# Patient Record
Sex: Female | Born: 1965 | Race: White | Hispanic: No
Health system: Southern US, Community
[De-identification: ages and names within clinical notes are randomized; demographics above are authoritative.]

---

## 2016-02-08 ENCOUNTER — Inpatient Hospital Stay (HOSPITAL_COMMUNITY): Payer: BLUE CROSS/BLUE SHIELD

## 2016-02-08 ENCOUNTER — Inpatient Hospital Stay (HOSPITAL_COMMUNITY)
Admission: AD | Admit: 2016-02-08 | Discharge: 2016-03-15 | DRG: 870 | Disposition: E | Payer: BLUE CROSS/BLUE SHIELD | Source: Other Acute Inpatient Hospital | Attending: Internal Medicine | Admitting: Internal Medicine

## 2016-02-08 DIAGNOSIS — Z66 Do not resuscitate: Secondary | ICD-10-CM | POA: Diagnosis not present

## 2016-02-08 DIAGNOSIS — K859 Acute pancreatitis without necrosis or infection, unspecified: Secondary | ICD-10-CM | POA: Diagnosis present

## 2016-02-08 DIAGNOSIS — R001 Bradycardia, unspecified: Secondary | ICD-10-CM | POA: Diagnosis present

## 2016-02-08 DIAGNOSIS — G9340 Encephalopathy, unspecified: Secondary | ICD-10-CM | POA: Insufficient documentation

## 2016-02-08 DIAGNOSIS — D696 Thrombocytopenia, unspecified: Secondary | ICD-10-CM | POA: Diagnosis present

## 2016-02-08 DIAGNOSIS — Z515 Encounter for palliative care: Secondary | ICD-10-CM | POA: Diagnosis not present

## 2016-02-08 DIAGNOSIS — D649 Anemia, unspecified: Secondary | ICD-10-CM | POA: Diagnosis present

## 2016-02-08 DIAGNOSIS — J189 Pneumonia, unspecified organism: Secondary | ICD-10-CM | POA: Diagnosis present

## 2016-02-08 DIAGNOSIS — I2782 Chronic pulmonary embolism: Secondary | ICD-10-CM | POA: Insufficient documentation

## 2016-02-08 DIAGNOSIS — Y95 Nosocomial condition: Secondary | ICD-10-CM | POA: Diagnosis present

## 2016-02-08 DIAGNOSIS — R601 Generalized edema: Secondary | ICD-10-CM | POA: Diagnosis present

## 2016-02-08 DIAGNOSIS — R600 Localized edema: Secondary | ICD-10-CM | POA: Diagnosis not present

## 2016-02-08 DIAGNOSIS — Z9911 Dependence on respirator [ventilator] status: Secondary | ICD-10-CM | POA: Insufficient documentation

## 2016-02-08 DIAGNOSIS — J80 Acute respiratory distress syndrome: Secondary | ICD-10-CM | POA: Insufficient documentation

## 2016-02-08 DIAGNOSIS — I2609 Other pulmonary embolism with acute cor pulmonale: Secondary | ICD-10-CM | POA: Diagnosis present

## 2016-02-08 DIAGNOSIS — A419 Sepsis, unspecified organism: Principal | ICD-10-CM | POA: Diagnosis present

## 2016-02-08 DIAGNOSIS — E109 Type 1 diabetes mellitus without complications: Secondary | ICD-10-CM | POA: Diagnosis present

## 2016-02-08 DIAGNOSIS — E870 Hyperosmolality and hypernatremia: Secondary | ICD-10-CM | POA: Diagnosis not present

## 2016-02-08 DIAGNOSIS — J9601 Acute respiratory failure with hypoxia: Secondary | ICD-10-CM | POA: Diagnosis not present

## 2016-02-08 DIAGNOSIS — I2601 Septic pulmonary embolism with acute cor pulmonale: Secondary | ICD-10-CM | POA: Insufficient documentation

## 2016-02-08 DIAGNOSIS — R609 Edema, unspecified: Secondary | ICD-10-CM | POA: Insufficient documentation

## 2016-02-08 DIAGNOSIS — R6521 Severe sepsis with septic shock: Secondary | ICD-10-CM | POA: Diagnosis present

## 2016-02-08 DIAGNOSIS — E873 Alkalosis: Secondary | ICD-10-CM | POA: Diagnosis present

## 2016-02-08 DIAGNOSIS — K863 Pseudocyst of pancreas: Secondary | ICD-10-CM | POA: Diagnosis present

## 2016-02-08 DIAGNOSIS — K852 Alcohol induced acute pancreatitis without necrosis or infection: Secondary | ICD-10-CM | POA: Diagnosis not present

## 2016-02-08 DIAGNOSIS — I82B11 Acute embolism and thrombosis of right subclavian vein: Secondary | ICD-10-CM | POA: Diagnosis not present

## 2016-02-08 DIAGNOSIS — E876 Hypokalemia: Secondary | ICD-10-CM | POA: Diagnosis present

## 2016-02-08 DIAGNOSIS — Z79899 Other long term (current) drug therapy: Secondary | ICD-10-CM

## 2016-02-08 DIAGNOSIS — I82A11 Acute embolism and thrombosis of right axillary vein: Secondary | ICD-10-CM | POA: Diagnosis not present

## 2016-02-08 DIAGNOSIS — J90 Pleural effusion, not elsewhere classified: Secondary | ICD-10-CM | POA: Diagnosis not present

## 2016-02-08 DIAGNOSIS — T501X5A Adverse effect of loop [high-ceiling] diuretics, initial encounter: Secondary | ICD-10-CM | POA: Diagnosis not present

## 2016-02-08 DIAGNOSIS — D72829 Elevated white blood cell count, unspecified: Secondary | ICD-10-CM | POA: Diagnosis present

## 2016-02-08 DIAGNOSIS — I82613 Acute embolism and thrombosis of superficial veins of upper extremity, bilateral: Secondary | ICD-10-CM | POA: Diagnosis not present

## 2016-02-08 DIAGNOSIS — I272 Other secondary pulmonary hypertension: Secondary | ICD-10-CM | POA: Diagnosis present

## 2016-02-08 DIAGNOSIS — I82611 Acute embolism and thrombosis of superficial veins of right upper extremity: Secondary | ICD-10-CM | POA: Diagnosis not present

## 2016-02-08 DIAGNOSIS — Z720 Tobacco use: Secondary | ICD-10-CM | POA: Diagnosis not present

## 2016-02-08 DIAGNOSIS — R579 Shock, unspecified: Secondary | ICD-10-CM | POA: Diagnosis not present

## 2016-02-08 DIAGNOSIS — E43 Unspecified severe protein-calorie malnutrition: Secondary | ICD-10-CM | POA: Diagnosis present

## 2016-02-08 DIAGNOSIS — R079 Chest pain, unspecified: Secondary | ICD-10-CM | POA: Diagnosis not present

## 2016-02-08 DIAGNOSIS — Z794 Long term (current) use of insulin: Secondary | ICD-10-CM | POA: Diagnosis not present

## 2016-02-08 DIAGNOSIS — I82C19 Acute embolism and thrombosis of unspecified internal jugular vein: Secondary | ICD-10-CM | POA: Diagnosis not present

## 2016-02-08 DIAGNOSIS — I2699 Other pulmonary embolism without acute cor pulmonale: Secondary | ICD-10-CM | POA: Diagnosis not present

## 2016-02-08 DIAGNOSIS — Z452 Encounter for adjustment and management of vascular access device: Secondary | ICD-10-CM

## 2016-02-08 DIAGNOSIS — L8990 Pressure ulcer of unspecified site, unspecified stage: Secondary | ICD-10-CM | POA: Insufficient documentation

## 2016-02-08 DIAGNOSIS — K858 Other acute pancreatitis without necrosis or infection: Secondary | ICD-10-CM | POA: Diagnosis not present

## 2016-02-08 DIAGNOSIS — Z4659 Encounter for fitting and adjustment of other gastrointestinal appliance and device: Secondary | ICD-10-CM | POA: Insufficient documentation

## 2016-02-08 LAB — LACTIC ACID, PLASMA: Lactic Acid, Venous: 1.6 mmol/L (ref 0.5–2.0)

## 2016-02-08 LAB — BLOOD GAS, ARTERIAL
Acid-base deficit: 6.7 mmol/L — ABNORMAL HIGH (ref 0.0–2.0)
BICARBONATE: 18.8 meq/L — AB (ref 20.0–24.0)
Drawn by: 418751
FIO2: 1
LHR: 16 {breaths}/min
O2 SAT: 93.4 %
PATIENT TEMPERATURE: 98.6
PCO2 ART: 41.5 mmHg (ref 35.0–45.0)
PEEP/CPAP: 15 cmH2O
PH ART: 7.279 — AB (ref 7.350–7.450)
Pressure control: 20 cmH2O
TCO2: 20.1 mmol/L (ref 0–100)
pO2, Arterial: 76.7 mmHg — ABNORMAL LOW (ref 80.0–100.0)

## 2016-02-08 LAB — COMPREHENSIVE METABOLIC PANEL
ALK PHOS: 81 U/L (ref 38–126)
ALT: 30 U/L (ref 14–54)
ANION GAP: 13 (ref 5–15)
AST: 76 U/L — ABNORMAL HIGH (ref 15–41)
Albumin: 1.5 g/dL — ABNORMAL LOW (ref 3.5–5.0)
BILIRUBIN TOTAL: 1.1 mg/dL (ref 0.3–1.2)
BUN: 6 mg/dL (ref 6–20)
CO2: 20 mmol/L — AB (ref 22–32)
CREATININE: 0.71 mg/dL (ref 0.44–1.00)
Calcium: 8.1 mg/dL — ABNORMAL LOW (ref 8.9–10.3)
Chloride: 104 mmol/L (ref 101–111)
Glucose, Bld: 234 mg/dL — ABNORMAL HIGH (ref 65–99)
Potassium: 4 mmol/L (ref 3.5–5.1)
SODIUM: 137 mmol/L (ref 135–145)
Total Protein: 4.8 g/dL — ABNORMAL LOW (ref 6.5–8.1)

## 2016-02-08 LAB — PROTIME-INR
INR: 1.34 (ref 0.00–1.49)
Prothrombin Time: 16.7 seconds — ABNORMAL HIGH (ref 11.6–15.2)

## 2016-02-08 LAB — CBC WITH DIFFERENTIAL/PLATELET
BASOS ABS: 0 10*3/uL (ref 0.0–0.1)
BASOS PCT: 0 %
EOS PCT: 0 %
Eosinophils Absolute: 0 10*3/uL (ref 0.0–0.7)
HEMATOCRIT: 28.9 % — AB (ref 36.0–46.0)
Hemoglobin: 9.4 g/dL — ABNORMAL LOW (ref 12.0–15.0)
Lymphocytes Relative: 3 %
Lymphs Abs: 0.4 10*3/uL — ABNORMAL LOW (ref 0.7–4.0)
MCH: 32.4 pg (ref 26.0–34.0)
MCHC: 32.5 g/dL (ref 30.0–36.0)
MCV: 99.7 fL (ref 78.0–100.0)
MONO ABS: 0.4 10*3/uL (ref 0.1–1.0)
MONOS PCT: 3 %
Neutro Abs: 12.5 10*3/uL — ABNORMAL HIGH (ref 1.7–7.7)
Neutrophils Relative %: 94 %
PLATELETS: 123 10*3/uL — AB (ref 150–400)
RBC: 2.9 MIL/uL — ABNORMAL LOW (ref 3.87–5.11)
RDW: 14.4 % (ref 11.5–15.5)
WBC: 13.3 10*3/uL — ABNORMAL HIGH (ref 4.0–10.5)

## 2016-02-08 LAB — MAGNESIUM: Magnesium: 1.5 mg/dL — ABNORMAL LOW (ref 1.7–2.4)

## 2016-02-08 LAB — GLUCOSE, CAPILLARY: Glucose-Capillary: 201 mg/dL — ABNORMAL HIGH (ref 65–99)

## 2016-02-08 LAB — LIPID PANEL
Cholesterol: 74 mg/dL (ref 0–200)
Triglycerides: 134 mg/dL (ref <150)
VLDL: 27 mg/dL (ref 0–40)

## 2016-02-08 LAB — PHOSPHORUS: PHOSPHORUS: 3.9 mg/dL (ref 2.5–4.6)

## 2016-02-08 LAB — LIPASE, BLOOD: Lipase: 20 U/L (ref 11–51)

## 2016-02-08 LAB — APTT: APTT: 38 s — AB (ref 24–37)

## 2016-02-08 MED ORDER — MIDAZOLAM HCL 2 MG/2ML IJ SOLN
2.0000 mg | INTRAMUSCULAR | Status: DC | PRN
  Administered 2016-02-09 (×4): 2 mg via INTRAVENOUS
  Filled 2016-02-08 (×3): qty 2

## 2016-02-08 MED ORDER — FUROSEMIDE 10 MG/ML IJ SOLN
40.0000 mg | Freq: Once | INTRAMUSCULAR | Status: AC
  Administered 2016-02-08: 40 mg via INTRAVENOUS
  Filled 2016-02-08: qty 4

## 2016-02-08 MED ORDER — SODIUM CHLORIDE 0.9 % IV SOLN
25.0000 ug/h | INTRAVENOUS | Status: DC
  Administered 2016-02-08 – 2016-02-09 (×2): 250 ug/h via INTRAVENOUS
  Administered 2016-02-10 – 2016-02-13 (×4): 100 ug/h via INTRAVENOUS
  Administered 2016-02-13 – 2016-02-14 (×2): 200 ug/h via INTRAVENOUS
  Administered 2016-02-14: 250 ug/h via INTRAVENOUS
  Administered 2016-02-15 (×3): 400 ug/h via INTRAVENOUS
  Filled 2016-02-08 (×15): qty 50

## 2016-02-08 MED ORDER — SODIUM CHLORIDE 0.9 % IV SOLN
250.0000 mL | INTRAVENOUS | Status: DC | PRN
  Administered 2016-02-09: 20 mL/h via INTRAVENOUS
  Administered 2016-02-11: 250 mL via INTRAVENOUS

## 2016-02-08 MED ORDER — ASPIRIN 300 MG RE SUPP
300.0000 mg | RECTAL | Status: AC
  Administered 2016-02-08: 300 mg via RECTAL
  Filled 2016-02-08: qty 1

## 2016-02-08 MED ORDER — ANTISEPTIC ORAL RINSE SOLUTION (CORINZ)
7.0000 mL | OROMUCOSAL | Status: DC
  Administered 2016-02-09 – 2016-02-15 (×65): 7 mL via OROMUCOSAL

## 2016-02-08 MED ORDER — FENTANYL CITRATE (PF) 100 MCG/2ML IJ SOLN: 50.0000 ug | Freq: Once | INTRAMUSCULAR | Status: DC

## 2016-02-08 MED ORDER — FENTANYL BOLUS VIA INFUSION
50.0000 ug | INTRAVENOUS | Status: DC | PRN
  Administered 2016-02-13 – 2016-02-15 (×3): 50 ug via INTRAVENOUS
  Filled 2016-02-08: qty 50

## 2016-02-08 MED ORDER — PANTOPRAZOLE SODIUM 40 MG IV SOLR
40.0000 mg | INTRAVENOUS | Status: DC
  Administered 2016-02-09 – 2016-02-10 (×2): 40 mg via INTRAVENOUS
  Filled 2016-02-08 (×2): qty 40

## 2016-02-08 MED ORDER — ENOXAPARIN SODIUM 40 MG/0.4ML ~~LOC~~ SOLN: 40.0000 mg | SUBCUTANEOUS | Status: DC

## 2016-02-08 MED ORDER — ASPIRIN 81 MG PO CHEW: 324.0000 mg | CHEWABLE_TABLET | ORAL | Status: AC

## 2016-02-08 MED ORDER — DOPAMINE-DEXTROSE 3.2-5 MG/ML-% IV SOLN: 2.5000 ug/kg/min | INTRAVENOUS | Status: DC

## 2016-02-08 MED ORDER — MIDAZOLAM HCL 2 MG/2ML IJ SOLN
2.0000 mg | INTRAMUSCULAR | Status: DC | PRN
  Administered 2016-02-08 – 2016-02-09 (×2): 2 mg via INTRAVENOUS
  Filled 2016-02-08 (×3): qty 2

## 2016-02-08 MED ORDER — SENNOSIDES 8.8 MG/5ML PO SYRP
5.0000 mL | ORAL_SOLUTION | Freq: Two times a day (BID) | ORAL | Status: DC | PRN
  Administered 2016-02-09: 5 mL
  Filled 2016-02-08 (×2): qty 5

## 2016-02-08 MED ORDER — INSULIN ASPART 100 UNIT/ML ~~LOC~~ SOLN
0.0000 [IU] | SUBCUTANEOUS | Status: DC
  Administered 2016-02-09: 3 [IU] via SUBCUTANEOUS
  Administered 2016-02-09: 8 [IU] via SUBCUTANEOUS
  Administered 2016-02-09 (×2): 2 [IU] via SUBCUTANEOUS
  Administered 2016-02-09 – 2016-02-10 (×4): 3 [IU] via SUBCUTANEOUS
  Administered 2016-02-10 (×2): 2 [IU] via SUBCUTANEOUS
  Administered 2016-02-11: 8 [IU] via SUBCUTANEOUS
  Administered 2016-02-11: 3 [IU] via SUBCUTANEOUS
  Administered 2016-02-11: 11 [IU] via SUBCUTANEOUS
  Administered 2016-02-11 (×2): 5 [IU] via SUBCUTANEOUS
  Administered 2016-02-11 (×2): 3 [IU] via SUBCUTANEOUS
  Administered 2016-02-12: 15 [IU] via SUBCUTANEOUS
  Administered 2016-02-12: 8 [IU] via SUBCUTANEOUS
  Administered 2016-02-12 (×2): 15 [IU] via SUBCUTANEOUS
  Administered 2016-02-12 – 2016-02-13 (×2): 11 [IU] via SUBCUTANEOUS
  Administered 2016-02-13 (×2): 8 [IU] via SUBCUTANEOUS
  Administered 2016-02-13: 15 [IU] via SUBCUTANEOUS
  Administered 2016-02-13 (×2): 11 [IU] via SUBCUTANEOUS
  Administered 2016-02-13: 5 [IU] via SUBCUTANEOUS
  Administered 2016-02-14: 3 [IU] via SUBCUTANEOUS
  Administered 2016-02-14 (×3): 8 [IU] via SUBCUTANEOUS
  Administered 2016-02-14 – 2016-02-15 (×2): 5 [IU] via SUBCUTANEOUS
  Administered 2016-02-15: 3 [IU] via SUBCUTANEOUS
  Administered 2016-02-15: 8 [IU] via SUBCUTANEOUS
  Administered 2016-02-15: 3 [IU] via SUBCUTANEOUS

## 2016-02-08 MED ORDER — CHLORHEXIDINE GLUCONATE 0.12% ORAL RINSE (MEDLINE KIT)
15.0000 mL | Freq: Two times a day (BID) | OROMUCOSAL | Status: DC
  Administered 2016-02-08 – 2016-02-15 (×14): 15 mL via OROMUCOSAL

## 2016-02-08 NOTE — Progress Notes (Signed)
Waste 100ml fentanyl with Ernie HewJohn Josiah Nieto RN

## 2016-02-08 NOTE — H&P (Signed)
PULMONARY / CRITICAL CARE MEDICINE   Name: Abigail Baker MRN: [id number] DOB: [birth year]    ADMISSION DATE:  01/27/2016  CHIEF COMPLAINT:  Pancreatitis  HISTORY OF PRESENT ILLNESS:   50 yo F with h/o DM, Etoh abuse, who presented to [facility] on 01/29/2016 after several weeks of abdominal pain, n/v.  She was found to have most likely EtOH pancreatitis, she developed ARDS and reportedly b/l PNA for which she was treated with abx.  She also developed a pancreatic pseudocyst.  She was transferred to [facility] for 24/7 CCM and advanced ventilator management given bouts of hypoxemia despite high PEEP/FiO2  PAST MEDICAL HISTORY :   has no past medical history on file.  has no past surgical history on file. Prior to Admission medications   Not on File   Allergies not on file  FAMILY HISTORY:  has no family status information on file.  SOCIAL HISTORY:    REVIEW OF SYSTEMS:  Unable to obtain  SUBJECTIVE:   VITAL SIGNS: Temp:  [97.7 F (36.5 C)] 97.7 F (36.5 C) (03/26 2152) Pulse Rate:  [90] 90 (03/26 2152) Resp:  [15] 15 (03/26 2152) BP: (127)/(42) 127/42 mmHg (03/26 2152) SpO2:  [94 %] 94 % (03/26 2154) FiO2 (%):  [100 %] 100 % (03/26 2154) Weight:  [74.9 kg (165 lb 2 oz)] 74.9 kg (165 lb 2 oz) (03/26 2152) HEMODYNAMICS:   VENTILATOR SETTINGS: Vent Mode:  [-] PCV FiO2 (%):  [100 %] 100 % Set Rate:  [16 bmp] 16 bmp PEEP:  [15 cmH20] 15 cmH20 INTAKE / OUTPUT:  Intake/Output Summary (Last 24 hours) at 01/16/2016 2232 Last data filed at 01/16/2016 2152  Gross per 24 hour  Intake     30 ml  Output    100 ml  Net    -70 ml    PHYSICAL EXAMINATION: General:  NAD Neuro:  Intubated and sedated,  Opens Eyes to voice but does not follow commands.  HEENT:  ETT in place, conjunctival edema Cardiovascular:  RRR, s1/s2 no m/r/g Lungs:  Course breath sounds b/l, no w/r/r Abdomen:  Soft, not TTP, no bowel sounds. No rebound or guarding Musculoskeletal:  Normal bulk and  tone Skin:  + Anasarca.  No c/c  LABS:  CBC No results for input(s): WBC, HGB, HCT, PLT in the last 168 hours. Coag's No results for input(s): APTT, INR in the last 168 hours. BMET No results for input(s): NA, K, CL, CO2, BUN, CREATININE, GLUCOSE in the last 168 hours. Electrolytes No results for input(s): CALCIUM, MG, PHOS in the last 168 hours. Sepsis Markers No results for input(s): LATICACIDVEN, PROCALCITON, O2SATVEN in the last 168 hours. ABG No results for input(s): PHART, PCO2ART, PO2ART in the last 168 hours. Liver Enzymes No results for input(s): AST, ALT, ALKPHOS, BILITOT, ALBUMIN in the last 168 hours. Cardiac Enzymes No results for input(s): TROPONINI, PROBNP in the last 168 hours. Glucose  Recent Labs Lab 02/11/2016 2150  GLUCAP 201*    Imaging No results found.   ASSESSMENT / PLAN: 50 yo with acute EtOH pancreatitis and resulting ARDS with anasarca and prolonged hospital course.  PULMONARY A: ARDS Intubated 3/16 B/L pleural effusion  P:   - Low TV ventilation/ARDS protocol - Plat <30 - Permissive hypercapnia pH >7.2 - ABG now - currently satting 95-100% - Goal O2 88-95% - Diuresis goal net negative 1-2L as BP tolerates - Fentanyl gtt and versed pushes for sedation - Oral care - PPI  CARDIOVASCULAR A:  Not  active P:   RENAL A:   AGMA - corrected AG = 19 P:   - Lactate negative - normal BUN - check UA for ketones - monitor AG  GASTROINTESTINAL A:   Acute EtOH pancreatitis Pancreatic pseudocyst x2 per OSH CT abdomen report  P:   - start tube feeding  - continue supportive care  HEMATOLOGIC A:   Anemia - no prior H/H Thrombocytopenia - likely EtOH S/p transfusion of PRBC for Hb of 8 P:  - no further transfusions - transfuse for Hb <7   INFECTIOUS A:   B/L PNA reported at OSH - s/p 10 days abx, unclear if this was real or simple severe ARDS P:   - holding ABX for now - check procalcitonin  ENDOCRINE A:   DM   P:    - SSI - add basal when Tube feeds are started - check Lipid panel  NEUROLOGIC A:   Sedated  P:   RASS goal: 0 -Fentanyl gtt - weaning this down now - Versed PRN (trying to minimize)    Total critical care time: 45 min  Critical care time was exclusive of separately billable procedures and treating other patients.  Critical care was necessary to treat or prevent imminent or life-threatening deterioration.  Critical care was time spent personally by me on the following activities: development of treatment plan with patient and/or surrogate as well as nursing, discussions with consultants, evaluation of patient's response to treatment, examination of patient, obtaining history from patient or surrogate, ordering and performing treatments and interventions, ordering and review of laboratory studies, ordering and review of radiographic studies, pulse oximetry and re-evaluation of patient's condition.   Galvin Profferaniel Negin Hegg, DO., MS [facility]    [facility] and Critical Care Medicine [facility] Pager: [phone]  02/01/2016, 10:32 PM

## 2016-02-08 NOTE — Progress Notes (Signed)
eLink Physician-Brief Progress Note Patient Name: Hardie Loraammy Hazan DOB: 01/19/1966 MRN: [id number]   Date of Service  01/30/2016  HPI/Events of Note  Hypotension  eICU Interventions  Dopamine 2.5 SCDs Protonix     Intervention Category Major Interventions: Hypotension - evaluation and management  Kristel Durkee 01/19/2016, 11:51 PM

## 2016-02-09 ENCOUNTER — Inpatient Hospital Stay (HOSPITAL_COMMUNITY): Payer: BLUE CROSS/BLUE SHIELD

## 2016-02-09 DIAGNOSIS — R579 Shock, unspecified: Secondary | ICD-10-CM

## 2016-02-09 DIAGNOSIS — L8990 Pressure ulcer of unspecified site, unspecified stage: Secondary | ICD-10-CM | POA: Insufficient documentation

## 2016-02-09 DIAGNOSIS — I2699 Other pulmonary embolism without acute cor pulmonale: Secondary | ICD-10-CM | POA: Insufficient documentation

## 2016-02-09 DIAGNOSIS — K852 Alcohol induced acute pancreatitis without necrosis or infection: Secondary | ICD-10-CM

## 2016-02-09 DIAGNOSIS — J9601 Acute respiratory failure with hypoxia: Secondary | ICD-10-CM | POA: Insufficient documentation

## 2016-02-09 DIAGNOSIS — J80 Acute respiratory distress syndrome: Secondary | ICD-10-CM

## 2016-02-09 LAB — POCT I-STAT 3, ART BLOOD GAS (G3+)
ACID-BASE DEFICIT: 4 mmol/L — AB (ref 0.0–2.0)
ACID-BASE EXCESS: 1 mmol/L (ref 0.0–2.0)
Acid-base deficit: 6 mmol/L — ABNORMAL HIGH (ref 0.0–2.0)
Acid-base deficit: 4 mmol/L — ABNORMAL HIGH (ref 0.0–2.0)
BICARBONATE: 26.8 meq/L — AB (ref 20.0–24.0)
BICARBONATE: 21.4 meq/L (ref 20.0–24.0)
BICARBONATE: 23.3 meq/L (ref 20.0–24.0)
Bicarbonate: 22.2 mEq/L (ref 20.0–24.0)
O2 SAT: 64 %
O2 SAT: 53 %
O2 SAT: 66 %
O2 Saturation: 89 %
PH ART: 7.3 — AB (ref 7.350–7.450)
PO2 ART: 34 mmHg — AB (ref 80.0–100.0)
Patient temperature: 36.6
TCO2: 28 mmol/L (ref 0–100)
TCO2: 23 mmol/L (ref 0–100)
TCO2: 25 mmol/L (ref 0–100)
TCO2: 24 mmol/L (ref 0–100)
pCO2 arterial: 46.9 mmHg — ABNORMAL HIGH (ref 35.0–45.0)
pCO2 arterial: 49.4 mmHg — ABNORMAL HIGH (ref 35.0–45.0)
pCO2 arterial: 53.2 mmHg — ABNORMAL HIGH (ref 35.0–45.0)
pCO2 arterial: 44.5 mmHg (ref 35.0–45.0)
pH, Arterial: 7.364 (ref 7.350–7.450)
pH, Arterial: 7.244 — ABNORMAL LOW (ref 7.350–7.450)
pH, Arterial: 7.25 — ABNORMAL LOW (ref 7.350–7.450)
pO2, Arterial: 33 mmHg — CL (ref 80.0–100.0)
pO2, Arterial: 40 mmHg — ABNORMAL LOW (ref 80.0–100.0)
pO2, Arterial: 58 mmHg — ABNORMAL LOW (ref 80.0–100.0)

## 2016-02-09 LAB — BASIC METABOLIC PANEL
ANION GAP: 15 (ref 5–15)
BUN: 8 mg/dL (ref 6–20)
CALCIUM: 8.1 mg/dL — AB (ref 8.9–10.3)
CO2: 18 mmol/L — AB (ref 22–32)
Chloride: 105 mmol/L (ref 101–111)
Creatinine, Ser: 0.61 mg/dL (ref 0.44–1.00)
Glucose, Bld: 167 mg/dL — ABNORMAL HIGH (ref 65–99)
Potassium: 2.9 mmol/L — ABNORMAL LOW (ref 3.5–5.1)
Sodium: 138 mmol/L (ref 135–145)

## 2016-02-09 LAB — URINALYSIS, ROUTINE W REFLEX MICROSCOPIC
Bilirubin Urine: NEGATIVE
GLUCOSE, UA: NEGATIVE mg/dL
HGB URINE DIPSTICK: NEGATIVE
Ketones, ur: NEGATIVE mg/dL
Leukocytes, UA: NEGATIVE
Nitrite: NEGATIVE
PH: 5 (ref 5.0–8.0)
Protein, ur: NEGATIVE mg/dL
SPECIFIC GRAVITY, URINE: 1.007 (ref 1.005–1.030)

## 2016-02-09 LAB — GLUCOSE, CAPILLARY
GLUCOSE-CAPILLARY: 139 mg/dL — AB (ref 65–99)
GLUCOSE-CAPILLARY: 148 mg/dL — AB (ref 65–99)
Glucose-Capillary: 176 mg/dL — ABNORMAL HIGH (ref 65–99)
Glucose-Capillary: 186 mg/dL — ABNORMAL HIGH (ref 65–99)
Glucose-Capillary: 188 mg/dL — ABNORMAL HIGH (ref 65–99)
Glucose-Capillary: 252 mg/dL — ABNORMAL HIGH (ref 65–99)

## 2016-02-09 LAB — MRSA PCR SCREENING: MRSA BY PCR: NEGATIVE

## 2016-02-09 LAB — MAGNESIUM: Magnesium: 2.1 mg/dL (ref 1.7–2.4)

## 2016-02-09 LAB — BRAIN NATRIURETIC PEPTIDE: B NATRIURETIC PEPTIDE 5: 777.2 pg/mL — AB (ref 0.0–100.0)

## 2016-02-09 LAB — TROPONIN I: TROPONIN I: 0.1 ng/mL — AB (ref <0.031)

## 2016-02-09 LAB — PROCALCITONIN: PROCALCITONIN: 0.14 ng/mL

## 2016-02-09 MED ORDER — SODIUM CHLORIDE 0.9 % IV SOLN: INTRAVENOUS | Status: DC | PRN

## 2016-02-09 MED ORDER — THIAMINE HCL 100 MG/ML IJ SOLN
100.0000 mg | Freq: Every day | INTRAMUSCULAR | Status: DC
  Administered 2016-02-09 – 2016-02-15 (×7): 100 mg via INTRAVENOUS
  Filled 2016-02-09: qty 1
  Filled 2016-02-09: qty 2
  Filled 2016-02-09 (×9): qty 1

## 2016-02-09 MED ORDER — ADULT MULTIVITAMIN LIQUID CH
15.0000 mL | Freq: Every day | ORAL | Status: DC
  Administered 2016-02-09 – 2016-02-15 (×7): 15 mL
  Filled 2016-02-09 (×8): qty 15

## 2016-02-09 MED ORDER — PRO-STAT SUGAR FREE PO LIQD
30.0000 mL | Freq: Two times a day (BID) | ORAL | Status: DC
  Administered 2016-02-09 – 2016-02-15 (×13): 30 mL
  Filled 2016-02-09 (×18): qty 30

## 2016-02-09 MED ORDER — PHENYLEPHRINE HCL 10 MG/ML IJ SOLN
0.0000 ug/min | INTRAVENOUS | Status: DC
  Administered 2016-02-09: 25 ug/min via INTRAVENOUS
  Administered 2016-02-10: 100 ug/min via INTRAVENOUS
  Administered 2016-02-10: 90 ug/min via INTRAVENOUS
  Administered 2016-02-10: 80 ug/min via INTRAVENOUS
  Administered 2016-02-10: 200 ug/min via INTRAVENOUS
  Administered 2016-02-11: 80 ug/min via INTRAVENOUS
  Administered 2016-02-11: 270 ug/min via INTRAVENOUS
  Administered 2016-02-11: 80 ug/min via INTRAVENOUS
  Administered 2016-02-11: 150 ug/min via INTRAVENOUS
  Administered 2016-02-11: 260 ug/min via INTRAVENOUS
  Administered 2016-02-12: 70 ug/min via INTRAVENOUS
  Administered 2016-02-13: 30 ug/min via INTRAVENOUS
  Administered 2016-02-13: 55 ug/min via INTRAVENOUS
  Filled 2016-02-09 (×14): qty 4

## 2016-02-09 MED ORDER — MAGNESIUM SULFATE 2 GM/50ML IV SOLN
2.0000 g | Freq: Once | INTRAVENOUS | Status: AC
  Administered 2016-02-09: 2 g via INTRAVENOUS
  Filled 2016-02-09: qty 50

## 2016-02-09 MED ORDER — QUETIAPINE FUMARATE 50 MG PO TABS
50.0000 mg | ORAL_TABLET | Freq: Every day | ORAL | Status: DC
  Administered 2016-02-09 – 2016-02-10 (×2): 50 mg via ORAL
  Filled 2016-02-09: qty 1
  Filled 2016-02-09: qty 2

## 2016-02-09 MED ORDER — PHENYLEPHRINE HCL 10 MG/ML IJ SOLN
0.0000 ug/min | INTRAVENOUS | Status: DC
  Administered 2016-02-09: 20 ug/min via INTRAVENOUS
  Filled 2016-02-09 (×2): qty 1

## 2016-02-09 MED ORDER — MAGNESIUM SULFATE IN D5W 10-5 MG/ML-% IV SOLN
1.0000 g | Freq: Once | INTRAVENOUS | Status: AC
  Administered 2016-02-09: 1 g via INTRAVENOUS
  Filled 2016-02-09: qty 100

## 2016-02-09 MED ORDER — DEXMEDETOMIDINE HCL IN NACL 400 MCG/100ML IV SOLN
0.4000 ug/kg/h | INTRAVENOUS | Status: DC
  Filled 2016-02-09 (×2): qty 100

## 2016-02-09 MED ORDER — IOPAMIDOL (ISOVUE-370) INJECTION 76%
INTRAVENOUS | Status: AC
  Administered 2016-02-09: 80 mL
  Filled 2016-02-09: qty 100

## 2016-02-09 MED ORDER — POTASSIUM CHLORIDE 10 MEQ/50ML IV SOLN
10.0000 meq | INTRAVENOUS | Status: AC
  Administered 2016-02-09 – 2016-02-10 (×6): 10 meq via INTRAVENOUS
  Filled 2016-02-09: qty 50

## 2016-02-09 MED ORDER — HEPARIN (PORCINE) IN NACL 100-0.45 UNIT/ML-% IJ SOLN
1000.0000 [IU]/h | INTRAMUSCULAR | Status: DC
  Administered 2016-02-09 – 2016-02-10 (×3): 1000 [IU]/h via INTRAVENOUS
  Filled 2016-02-09 (×4): qty 250

## 2016-02-09 MED ORDER — HEPARIN BOLUS VIA INFUSION
3500.0000 [IU] | Freq: Once | INTRAVENOUS | Status: AC
  Administered 2016-02-09: 3500 [IU] via INTRAVENOUS
  Filled 2016-02-09: qty 3500

## 2016-02-09 MED ORDER — VITAL HIGH PROTEIN PO LIQD
1000.0000 mL | ORAL | Status: DC
  Administered 2016-02-11: 1000 mL
  Administered 2016-02-12: 06:00:00
  Administered 2016-02-13: 1000 mL

## 2016-02-09 MED ORDER — FOLIC ACID 5 MG/ML IJ SOLN
1.0000 mg | Freq: Every day | INTRAMUSCULAR | Status: DC
  Administered 2016-02-09 – 2016-02-15 (×7): 1 mg via INTRAVENOUS
  Filled 2016-02-09: qty 0.2
  Filled 2016-02-09: qty 0
  Filled 2016-02-09: qty 0.2
  Filled 2016-02-09: qty 0
  Filled 2016-02-09: qty 0.2
  Filled 2016-02-09: qty 0
  Filled 2016-02-09: qty 0.2
  Filled 2016-02-09: qty 0
  Filled 2016-02-09 (×2): qty 0.2
  Filled 2016-02-09 (×2): qty 0
  Filled 2016-02-09: qty 0.2

## 2016-02-09 MED ORDER — IPRATROPIUM-ALBUTEROL 0.5-2.5 (3) MG/3ML IN SOLN
3.0000 mL | Freq: Four times a day (QID) | RESPIRATORY_TRACT | Status: DC
  Administered 2016-02-09 – 2016-02-15 (×24): 3 mL via RESPIRATORY_TRACT
  Filled 2016-02-09 (×24): qty 3

## 2016-02-09 MED ORDER — NICOTINE 7 MG/24HR TD PT24
7.0000 mg | MEDICATED_PATCH | Freq: Every day | TRANSDERMAL | Status: DC
  Administered 2016-02-09 – 2016-02-11 (×3): 7 mg via TRANSDERMAL
  Filled 2016-02-09 (×4): qty 1

## 2016-02-09 MED ORDER — HEPARIN SODIUM (PORCINE) 5000 UNIT/ML IJ SOLN
5000.0000 [IU] | Freq: Three times a day (TID) | INTRAMUSCULAR | Status: DC
  Administered 2016-02-09: 5000 [IU] via SUBCUTANEOUS
  Filled 2016-02-09: qty 1

## 2016-02-09 MED ORDER — MIDAZOLAM BOLUS VIA INFUSION
1.0000 mg | INTRAVENOUS | Status: DC | PRN
  Administered 2016-02-12 (×3): 1 mg via INTRAVENOUS
  Administered 2016-02-12 – 2016-02-13 (×3): 2 mg via INTRAVENOUS
  Administered 2016-02-13: 1 mg via INTRAVENOUS
  Administered 2016-02-14: 2 mg via INTRAVENOUS
  Administered 2016-02-14 – 2016-02-15 (×3): 4 mg via INTRAVENOUS
  Filled 2016-02-09 (×12): qty 4

## 2016-02-09 MED ORDER — SODIUM CHLORIDE 0.9 % IV SOLN
1.0000 mg/h | INTRAVENOUS | Status: DC
  Administered 2016-02-09: 1 mg/h via INTRAVENOUS
  Administered 2016-02-10 – 2016-02-11 (×2): 2 mg/h via INTRAVENOUS
  Administered 2016-02-12: 4 mg/h via INTRAVENOUS
  Administered 2016-02-12: 3 mg/h via INTRAVENOUS
  Administered 2016-02-13: 5 mg/h via INTRAVENOUS
  Administered 2016-02-14: 2 mg/h via INTRAVENOUS
  Administered 2016-02-14: 5 mg/h via INTRAVENOUS
  Administered 2016-02-14: 7 mg/h via INTRAVENOUS
  Administered 2016-02-15 – 2016-02-16 (×4): 8 mg/h via INTRAVENOUS
  Filled 2016-02-09 (×17): qty 10

## 2016-02-09 MED FILL — Dopamine in Dextrose 5% Inj 3.2 MG/ML: INTRAVENOUS | Qty: 250 | Status: AC

## 2016-02-09 NOTE — Progress Notes (Signed)
Respiratory therapy note- Unable after 3 attempts to thread arterial line radial. Dr. Jamison NeighborNestor notified by RN.

## 2016-02-09 NOTE — Progress Notes (Signed)
eLink Physician-Brief Progress Note Patient Name: Hardie Loraammy Deveney DOB: 02/23/1966 MRN: [id number]   Date of Service  02/09/2016  HPI/Events of Note  K+ = 2.9 and Creatinine = 0.61.  eICU Interventions  Will replete K+.      Intervention Category Intermediate Interventions: Electrolyte abnormality - evaluation and management  Yaniris Braddock Eugene 02/09/2016, 7:01 PM

## 2016-02-09 NOTE — Significant Event (Signed)
Received called from Radiology regarding CT showing large PEs. Spoke to CCM. To start on heparin drip.

## 2016-02-09 NOTE — Progress Notes (Signed)
eLink Physician-Brief Progress Note Patient Name: Abigail Baker DOB: 05/06/1966 MRN: [id number]   Date of Service  02/09/2016  HPI/Events of Note  Multiple issues: 1. PE _ R lung clot, 2. BP = 120 on Phenylephrine IV infusion and 3. Sats in 80's on 100% FiO2.   eICU Interventions  Will order: 1. Heparin IV infusion per pharmacy.  2. Will ask Dr. Christene Slatese Dios to evaluate the patient at bedside for possible systemic lytic Rx.      Intervention Category Major Interventions: Hypoxemia - evaluation and management  Haniel Fix Eugene 02/09/2016, 5:21 PM

## 2016-02-09 NOTE — Significant Event (Signed)
Spoke with Dr. Celene SkeenNester regarding patient's VS and ABG results. MD at bedside. New orders given.

## 2016-02-09 NOTE — Significant Event (Signed)
HR dropped to 30s whenever patient is agitated and biting on ET tube. Bite block placed on patient-HR did not drop anymore afterwards.

## 2016-02-09 NOTE — Progress Notes (Signed)
ANTICOAGULATION CONSULT NOTE - Initial Consult  Pharmacy Consult for heparin Indication: pulmonary embolus  No Known Allergies  Patient Measurements: Height: 5\' 1"  (154.9 cm) Weight: 157 lb 6.5 oz (71.4 kg) IBW/kg (Calculated) : 47.8 Heparin Dosing Weight: 62 kg  Vital Signs: Temp: 98.4 F (36.9 C) (03/27 1615) Temp Source: Core (Comment) (03/27 1200) BP: 99/79 mmHg (03/27 1615) Pulse Rate: 95 (03/27 1615)  Labs:  Recent Labs  05-Jan-2016 2305  HGB 9.4*  HCT 28.9*  PLT 123*  APTT 38*  LABPROT 16.7*  INR 1.34  CREATININE 0.71    Estimated Creatinine Clearance: 78.5 mL/min (by C-G formula based on Cr of 0.71).  Assessment: [age] YOF who presented from [facility] on 01/29/16 with likely EtOH pancreatitis and ARDS for ventilator management    Now with PE on CTA - small PE in LLL branch; large PE in rPA - submassive PE  No AC PTA  Goal of Therapy:  Heparin level 0.3-0.7 units/ml Monitor platelets by anticoagulation protocol: Yes   Plan:  Give 3500 units bolus x 1 Start heparin infusion at 1000 units/hr Check anti-Xa level in 6 hours and daily while on heparin Continue to monitor H&H and platelets  Isaac BlissMichael Anju Sereno, PharmD, BCPS, [facility] Clinical Pharmacist Pager [phone] 02/09/2016 5:28 PM

## 2016-02-09 NOTE — Progress Notes (Addendum)
PULMONARY / CRITICAL CARE MEDICINE   Name: Abigail Baker MRN: [id number] DOB: 08-21-1966    ADMISSION DATE:  01/30/2016  CHIEF COMPLAINT:  Pancreatitis  HISTORY OF PRESENT ILLNESS:   50 yo F with h/o DM, Etoh abuse, who presented to [facility] on 01/29/2016 after several weeks of abdominal pain, n/v.  She was found to have most likely EtOH pancreatitis, she developed ARDS and reportedly b/l PNA for which she was treated with abx.  She also developed a pancreatic pseudocyst.  She was transferred to [facility] hospital for 24/7 CCM and advanced ventilator management given bouts of hypoxemia despite high PEEP/FiO2  SUBJECTIVE: Patient was transferred from OSH last evening. Patient has having periods of desaturation & bradycardia likely secondary to biting her endotracheal tube with stimulation. Bite block was placed.  Still on 100% FiO2 PEEP 14.   Neo drip less this pm. CTA showed DPLD, ARDS, also with a big embolus in R PA and small embolus in LLL. IR has been contacted for EKOS, lytics.   REVIEW OF SYSTEMS:  Unable to obtain given intubation & sedation.  VITAL SIGNS: Temp:  [96.8 F (36 C)-98.6 F (37 C)] 98.4 F (36.9 C) (03/27 1615) Pulse Rate:  [42-108] 95 (03/27 1615) Resp:  [0-33] 20 (03/27 1615) BP: (70-136)/(39-114) 99/79 mmHg (03/27 1615) SpO2:  [52 %-99 %] 88 % (03/27 1700) Arterial Line BP: (82-128)/(43-72) 127/60 mmHg (03/27 1615) FiO2 (%):  [80 %-100 %] 100 % (03/27 1550) Weight:  [157 lb 6.5 oz (71.4 kg)-165 lb 2 oz (74.9 kg)] 157 lb 6.5 oz (71.4 kg) (03/27 0500) HEMODYNAMICS:   VENTILATOR SETTINGS: Vent Mode:  [-] PCV FiO2 (%):  [80 %-100 %] 100 % Set Rate:  [16 bmp] 16 bmp Vt Set:  [380 mL] 380 mL PEEP:  [12 cmH20-15 cmH20] 14 cmH20 Plateau Pressure:  [11 cmH20-34 cmH20] 31 cmH20 INTAKE / OUTPUT:  Intake/Output Summary (Last 24 hours) at 02/09/16 1900 Last data filed at 02/09/16 1100  Gross per 24 hour  Intake 388.25 ml  Output   2985 ml  Net -2596.75 ml     PHYSICAL EXAMINATION: General:  Sedated. No distress. Mother & sister at bedside.  Neuro:  Intubated and sedated. Spontaneously moving all 4 extremities.  HEENT:  Endotracheal tube in place. No scleral icterus or injection. Cardiovascular:  Regular rate. Anasarca. Normal S1 & S2. Lungs:  Course breath sounds bilaterally. Symmetric chest wall rise on ventilator.  Abdomen:  Soft. Nondistended. Hypoactive bowel sounds. Integument:  Warm & Dry. No rash on exposed skin.  LABS:  CBC  Recent Labs Lab 02/13/2016 2305  WBC 13.3*  HGB 9.4*  HCT 28.9*  PLT 123*   Coag's  Recent Labs Lab 02/11/2016 2305  APTT 38*  INR 1.34   BMET  Recent Labs Lab 02/12/2016 2305 02/09/16 1730  NA 137 138  K 4.0 2.9*  CL 104 105  CO2 20* 18*  BUN 6 8  CREATININE 0.71 0.61  GLUCOSE 234* 167*   Electrolytes  Recent Labs Lab 01/27/2016 2305 02/09/16 1730  CALCIUM 8.1* 8.1*  MG 1.5* 2.1  PHOS 3.9  --    Sepsis Markers  Recent Labs Lab 01/29/2016 2305  LATICACIDVEN 1.6  PROCALCITON 0.14   ABG  Recent Labs Lab 02/09/16 1245 02/09/16 1311 02/09/16 1847  PHART 7.250* 7.244* 7.300*  PCO2ART 53.2* 49.4* 44.5  PO2ART 33.0* 40.0* 58.0*   Liver Enzymes  Recent Labs Lab 01/31/2016 2305  AST 76*  ALT 30  ALKPHOS 81  BILITOT 1.1  ALBUMIN 1.5*   Cardiac Enzymes No results for input(s): TROPONINI, PROBNP in the last 168 hours. Glucose  Recent Labs Lab 01/25/2016 2150 02/09/16 0014 02/09/16 0349 02/09/16 0830 02/09/16 1227 02/09/16 1613  GLUCAP 201* 252* 188* 139* 186* 176*    Imaging Ct Angio Chest Pe W/cm &/or Wo Cm  02/09/2016  CLINICAL DATA:  Acute respiratory failure with hypoxia. EXAM: CT ANGIOGRAPHY CHEST WITH CONTRAST TECHNIQUE: Multidetector CT imaging of the chest was performed using the standard protocol during bolus administration of intravenous contrast. Multiplanar CT image reconstructions and MIPs were obtained to evaluate the vascular anatomy. CONTRAST:  80  mL of Isovue 370 intravenously. COMPARISON:  None. FINDINGS: No pneumothorax is noted. Endotracheal tube is in grossly good position. Moderate bilateral pleural effusions are noted. Diffuse bilateral lung opacities are noted concerning for pneumonia or edema. There is no evidence of thoracic aortic dissection or aneurysm. Small embolus is noted in lower lobe branch of left pulmonary artery. Large embolus is noted in the right pulmonary artery which extends into upper and lower lobe branches. Moderate anasarca is noted. RV/LV ratio of 1.1 is noted. Multiple old left rib fractures are noted. Review of the MIP images confirms the above findings. IMPRESSION: Diffuse bilateral lung opacities are noted concerning for edema or pneumonia. Moderate bilateral pleural effusions are noted. Moderate anasarca is noted. Small pulmonary embolus is noted peripherally in lower lobe branch of left pulmonary artery. Large central embolus is noted in the right pulmonary artery. Positive for acute PE with CT evidence of right heart strain (RV/LV Ratio = 1.1) consistent with at least submassive (intermediate risk) PE. The presence of right heart strain has been associated with an increased risk of morbidity and mortality. Please activate Code PE by paging [phone]. Critical Value/emergent results were called by telephone at the time of interpretation on 02/09/2016 at 5:05 pm to [facility] Ksor, patient's nurse, who verbally acknowledged these results and will immediately contact the ordering physician. Electronically Signed   By: Lupita RaiderJames  Green Jr, M.D.   On: 02/09/2016 17:06   Dg Chest Port 1 View  02/09/2016  CLINICAL DATA:  Acute respiratory failure with hypoxia. EXAM: PORTABLE CHEST 1 VIEW COMPARISON:  Chest x-ray dated 02/04/2016. FINDINGS: Endotracheal tube remains well positioned with tip just above the level of the carina. Enteric tube passes below the diaphragm. Right IJ central line is well positioned with tip at the level of the  lower SVC. Heart size is normal. Overall cardiomediastinal silhouette is stable in size and configuration. The diffuse bilateral pulmonary edema pattern is stable, perhaps slightly improved aeration bilaterally. Dense opacities at each lung base are unchanged, most likely atelectasis and/or small effusions. IMPRESSION: Stable exam with diffuse bilateral pulmonary edema pattern versus ARDS, perhaps mildly improved aeration bilaterally. Persistent bibasilar atelectasis and/or small effusions. Tubes and lines remain well-positioned. A left lateral rib fracture was suspected on yesterday's exam, better seen on the previous chest x-ray. Electronically Signed   By: Bary RichardStan  Maynard M.D.   On: 02/09/2016 13:54   Dg Chest Port 1 View  01/26/2016  CLINICAL DATA:  Endotracheal tube placement.  Initial encounter. EXAM: PORTABLE CHEST 1 VIEW COMPARISON:  None. FINDINGS: The patient's endotracheal tube is seen ending 1-2 cm above the carina. This could be retracted 1-2 cm, as deemed clinically appropriate. A right IJ line is noted ending about the cavoatrial junction. The patient's enteric tube is noted extending below the diaphragm. Diffuse bilateral airspace opacification may reflect pulmonary edema or pneumonia.  ARDS cannot be excluded. Small bilateral pleural effusions are suspected. No pneumothorax is seen. The cardiomediastinal silhouette is normal in size. There is suggestion of a minimally displaced fracture of the left lateral fifth rib. IMPRESSION: 1. Endotracheal tube seen ending 1-2 cm above the carina. This could be retracted 1-2 cm, as deemed clinically appropriate. 2. Right IJ line noted ending about the cavoatrial junction. 3. Diffuse bilateral airspace opacification may reflect pulmonary edema or pneumonia. ARDS cannot be excluded. Small bilateral pleural effusions suspected. 4. Suggestion of a minimally displaced fracture of the left lateral fifth rib. Electronically Signed   By: Roanna Raider M.D.   On:  01/21/2016 23:23   EVENTS: 3/16 - Admit to OSH [facility]) 3/19 - Worsening hypoxia & altered mentation. Transferred to ICU. Failed trial of BiPAP & was intubated. 3/26 - Transfer to [facility] 3/27 CTA with big R PA embolus, small LLL embolus. IR consulted.   STUDIES: CT ABD/PELVIS OSH 3/23:  Subacute pancreatitis w/ 6.9x3.6cm pseudocyst in lesser sac & 7mm pseudocyst in tail. Moderate bilateral pleural effusions.  Port CXR 3/26:  Personally reviewed by me. R IJ at cavoatrial junction. ETT 2cm above carina. Bilateral opacities.  MICROBIOLOGY: MRSA PCR 3/26:  Negative   ANTIBIOTICS: None.  LINES/TUBES: OETT [place] 3/19>>> R IJ CVL 3/19 or 3/20>>> OGT>>> F[place]>>>  ASSESSMENT / PLAN:  PULMONARY A: ARDS Pulmonary Embolus in R PA, and LLL PA. With evidence of RHS.  Bilateral Pleural Effusions Tobacco Use  P:   Full Vent Support w/ PRVC 8cc/kg IBW Holding on further diuresis given hypotension Duoneb q6hr Nicotine Patch /24hr ABG at noon Spoke to IR re: lytics/EKOS >> IR to see pt.  Order Echo, bnp, troponin.   No family at bedside.   Addendum :  1. spoke to IR >> hold off on lytics for now. Try to do thoracentesis and see if oxygenation improves. 2. Pt has other issues affecting hypoxemia. Cont heparin drip. diuresce when BP allows. Check echo, bnp, troponin. If worsens clinically, re consult IR re: lytics. Will need a cranial ct scan.   I spent 30 minutes of critical care time with this pt today.    Pollie Meyer, MD 02/09/2016, 7:04 PM [facility] Pager [phone] After 3 pm or if no answer, call [phone]

## 2016-02-09 NOTE — Progress Notes (Signed)
Initial Nutrition Assessment  DOCUMENTATION CODES:   Obesity unspecified  INTERVENTION:    Once CORTRAK tube placed, initiate Vital High Protein at 20 ml/hr and increase by 10 ml in 4 hours to goal rate of 30 ml/hr   Prostat liquid protein 30 ml BID   Liquid multivitamin daily  Total above TF regimen to provide 920 kcals, 93 gm protein, 602 ml of free water  NUTRITION DIAGNOSIS:   Inadequate oral intake related to inability to eat as evidenced by NPO status  GOAL:   Provide needs based on ASPEN/SCCM guidelines  MONITOR:   TF tolerance, Vent status, Labs, Weight trends, Skin, I & O's  REASON FOR ASSESSMENT:   Consult, Low Braden Enteral/tube feeding initiation and management  ASSESSMENT:   50 yo F with h/o DM, Etoh abuse, who presented to [facility] on 01/29/2016 after several weeks of abdominal pain, n/v. She was found to have most likely EtOH pancreatitis, she developed ARDS and reportedly b/l PNA for which she was treated with abx. She also developed a pancreatic pseudocyst. [facility] CCM and advanced ventilator management given bouts of hypoxemia despite high PEEP/FiO2.  Patient is currently intubated on ventilator support -- OGT to LIS Temp (24hrs), Avg:97.6 F (36.4 C), Min:97.2 F (36.2 C), Max:97.9 F (36.6 C)   RD consulted for TF initiation & management.  CORTRAK tube placement pending.  CCM MD requesting post pyloric tube tip.  RD unable to complete Nutrition Focused Physical Exam at this time.  Pt with mittens on and flailing arms.  Diet Order:  Diet NPO time specified  Skin:  Wound (see comment) (DTI to lip)  Last BM:  N/A  Height:   Ht Readings from Last 1 Encounters:  09/26/16 5' (1.524 m)    Weight:   Wt Readings from Last 1 Encounters:  02/09/16 157 lb 6.5 oz (71.4 kg)    Ideal Body Weight:  45.4 kg  BMI:  Body mass index is 30.74 kg/(m^2).  Estimated Nutritional Needs:   Kcal:   781-994  Protein:  90-100 gm  Fluid:  per MD  EDUCATION NEEDS:   No education needs identified at this time  Maureen ChattersKatie Monette Omara, RD, LDN Pager #: [phone] After-Hours Pager #: [phone]

## 2016-02-09 NOTE — Procedures (Signed)
Arterial Catheter Insertion Procedure Note Hardie Loraammy Burkman [id number] 06/10/1966  Procedure: Insertion of Arterial Catheter  Indications: Blood pressure monitoring  Procedure Details Consent: Unable to obtain consent because of emergent medical necessity. Time Out: Verified patient identification, verified procedure, site/side was marked, verified correct patient position, special equipment/implants available, medications/allergies/relevent history reviewed, required imaging and test results available.  Performed  Maximum sterile technique was used including antiseptics, cap, gloves, gown, hand hygiene, mask and sheet. Skin prep: Chlorhexidine; local anesthetic administered 20 gauge catheter was inserted into right radial artery using the Seldinger technique.  Evaluation Blood flow good; BP tracing good. Complications: No apparent complications.  Joneen RoachPaul Hoffman, AGACNP-BC [facility] Pager [phone] or [phone]  02/09/2016 1:08 PM

## 2016-02-09 NOTE — Consult Note (Signed)
Chief Complaint: Resp. Failure with acute PE and heart strain by CTA  Referring Physician(s):   History of Present Illness: Abigail Baker is a 50 y.o. female  With DM, ETOH pancreatitis and respiratory failure / ARDS.  Intubated and transferred from [facility].  CTA on arrival shows Rt hilar moderate to large acute PE with mild heart strain and Severe bilateral ARDS/edema pattern with bilateral pleural effusions, larger on the right.  Currently on vent, and Neo.  IV heparin just started about 2 hours ago.  Called to consider pt. For Right PE lysis.  No past medical history on file.  No past surgical history on file.  Allergies: Review of patient's allergies indicates no known allergies.  Medications: Prior to Admission medications   Medication Sig Start Date End Date Taking? Authorizing Provider  acetaminophen (TYLENOL) 325 MG tablet Take 650 mg by mouth every 6 (six) hours as needed for mild pain or fever (fever above 100.4).   Yes Historical Provider, MD  acetaminophen (TYLENOL) 650 MG suppository Place 650 mg rectally every 6 (six) hours as needed for mild pain or fever (above 100.4).   Yes Historical Provider, MD  alum & mag hydroxide-simeth (MAALOX/MYLANTA) 200-200-20 MG/5ML suspension Take 30 mLs by mouth every 4 (four) hours as needed for indigestion or heartburn.   Yes Historical Provider, MD  dicyclomine (BENTYL) 20 MG tablet Take 20 mg by mouth every 6 (six) hours. 01/19/16  Yes Historical Provider, MD  docusate sodium (COLACE) 100 MG capsule Take 100 mg by mouth daily as needed for mild constipation.   Yes Historical Provider, MD  enalapril (VASOTEC) 5 MG tablet Take 2.5 mg by mouth every morning. 01/20/16  Yes Historical Provider, MD  enoxaparin (LOVENOX) 40 MG/0.4ML injection Inject 40 mg into the skin daily.   Yes Historical Provider, MD  GLUCERNA (GLUCERNA) LIQD Give 1,000 mLs by tube daily. Start OGT feedin at 96mL/hr, do not titrate   Yes Historical Provider, MD    guaiFENesin-dextromethorphan (ROBITUSSIN DM) 100-10 MG/5ML syrup Take 10 mLs by mouth every 6 (six) hours as needed for cough.   Yes Historical Provider, MD  insulin regular (NOVOLIN R,HUMULIN R) 100 units/mL injection Inject into the skin every 6 (six) hours. Sliding Scale   Yes Historical Provider, MD  levothyroxine (SYNTHROID, LEVOTHROID) 125 MCG tablet Take 125 mcg by mouth daily before breakfast.   Yes Historical Provider, MD  lipase/protease/amylase (CREON) 12000 units CPEP capsule Take 24,000 Units by mouth 3 (three) times daily with meals.   Yes Historical Provider, MD  LORazepam (ATIVAN) 2 MG/ML injection Inject 2 mg into the vein every 4 (four) hours as needed (withdrawal score 10-12).   Yes Historical Provider, MD  magnesium hydroxide (MILK OF MAGNESIA) 400 MG/5ML suspension Take 30 mLs by mouth daily as needed for mild constipation.   Yes Historical Provider, MD  ondansetron (ZOFRAN) 40 MG/20ML SOLN injection Inject 4 mg into the vein every 6 (six) hours as needed for nausea or vomiting.   Yes Historical Provider, MD  pantoprazole (PROTONIX) 40 MG injection Inject 40 mg into the vein daily.   Yes Historical Provider, MD  Prenatal Vit-Fe Fumarate-FA (PRENATAL MULTIVITAMIN) TABS tablet Take 1 tablet by mouth daily at 12 noon.   Yes Historical Provider, MD  promethazine (PHENERGAN) 25 MG/ML injection Inject 12.5 mg into the vein every 6 (six) hours as needed for nausea or vomiting.   Yes Historical Provider, MD  propranolol (INDERAL) 10 MG tablet Take 10 mg  by mouth 2 (two) times daily.   Yes Historical Provider, MD  ranitidine (ZANTAC) 150 MG tablet Take 150 mg by mouth 2 (two) times daily. 01/28/16  Yes Historical Provider, MD  sertraline (ZOLOFT) 100 MG tablet Take 200 mg by mouth daily. 01/20/16  Yes Historical Provider, MD  temazepam (RESTORIL) 15 MG capsule Take 15 mg by mouth at bedtime as needed for sleep (insomnia).   Yes Historical Provider, MD  thiamine (VITAMIN B-1) 100 MG tablet  Take 100 mg by mouth daily.   Yes Historical Provider, MD     No family history on file.  Social History   Social History  . Marital Status: Divorced    Spouse Name: N/A  . Number of Children: N/A  . Years of Education: N/A   Social History Main Topics  . Smoking status: Not on file  . Smokeless tobacco: Not on file  . Alcohol Use: Not on file  . Drug Use: Not on file  . Sexual Activity: Not on file   Other Topics Concern  . Not on file   Social History Narrative  . No narrative on file    EReview of Systems: A 12 point ROS discussed and pertinent positives are indicated in the HPI above.  All other systems are negative.  Review of Systems  Vital Signs: BP 99/79 mmHg  Pulse 95  Temp(Src) 98.4 F (36.9 C) (Core (Comment))  Resp 20  Ht 5\' 1"  (1.549 m)  Wt 157 lb 6.5 oz (71.4 kg)  BMI 29.76 kg/m2  SpO2 88%  LMP  (LMP Unknown)  Physical Exam  Deferred for now  Mallampati Score:   intubated  Imaging: Ct Angio Chest Pe W/cm &/or Wo Cm  02/09/2016  CLINICAL DATA:  Acute respiratory failure with hypoxia. EXAM: CT ANGIOGRAPHY CHEST WITH CONTRAST TECHNIQUE: Multidetector CT imaging of the chest was performed using the standard protocol during bolus administration of intravenous contrast. Multiplanar CT image reconstructions and MIPs were obtained to evaluate the vascular anatomy. CONTRAST:  80 mL of Isovue 370 intravenously. COMPARISON:  None. FINDINGS: No pneumothorax is noted. Endotracheal tube is in grossly good position. Moderate bilateral pleural effusions are noted. Diffuse bilateral lung opacities are noted concerning for pneumonia or edema. There is no evidence of thoracic aortic dissection or aneurysm. Small embolus is noted in lower lobe branch of left pulmonary artery. Large embolus is noted in the right pulmonary artery which extends into upper and lower lobe branches. Moderate anasarca is noted. RV/LV ratio of 1.1 is noted. Multiple old left rib fractures are  noted. Review of the MIP images confirms the above findings. IMPRESSION: Diffuse bilateral lung opacities are noted concerning for edema or pneumonia. Moderate bilateral pleural effusions are noted. Moderate anasarca is noted. Small pulmonary embolus is noted peripherally in lower lobe branch of left pulmonary artery. Large central embolus is noted in the right pulmonary artery. Positive for acute PE with CT evidence of right heart strain (RV/LV Ratio = 1.1) consistent with at least submassive (intermediate risk) PE. The presence of right heart strain has been associated with an increased risk of morbidity and mortality. Please activate Code PE by paging [phone]. Critical Value/emergent results were called by telephone at the time of interpretation on 02/09/2016 at 5:05 pm to [facility] K[facility], patient's nurse, who verbally acknowledged these results and will immediately contact the ordering physician. Electronically Signed   By: Lupita Raider, M.D.   On: 02/09/2016 17:06   Dg Chest [facility]  02/09/2016  CLINICAL DATA:  Acute respiratory failure with hypoxia. EXAM: PORTABLE CHEST 1 VIEW COMPARISON:  Chest x-ray dated 02/07/2016. FINDINGS: Endotracheal tube remains well positioned with tip just above the level of the carina. Enteric tube passes below the diaphragm. Right IJ central line is well positioned with tip at the level of the lower SVC. Heart size is normal. Overall cardiomediastinal silhouette is stable in size and configuration. The diffuse bilateral pulmonary edema pattern is stable, perhaps slightly improved aeration bilaterally. Dense opacities at each lung base are unchanged, most likely atelectasis and/or small effusions. IMPRESSION: Stable exam with diffuse bilateral pulmonary edema pattern versus ARDS, perhaps mildly improved aeration bilaterally. Persistent bibasilar atelectasis and/or small effusions. Tubes and lines remain well-positioned. A left lateral rib fracture was suspected on  yesterday's exam, better seen on the previous chest x-ray. Electronically Signed   By: Bary Richard M.D.   On: 02/09/2016 13:54   Dg Chest Port 1 View  01/30/2016  CLINICAL DATA:  Endotracheal tube placement.  Initial encounter. EXAM: PORTABLE CHEST 1 VIEW COMPARISON:  None. FINDINGS: The patient's endotracheal tube is seen ending 1-2 cm above the carina. This could be retracted 1-2 cm, as deemed clinically appropriate. A right IJ line is noted ending about the cavoatrial junction. The patient's enteric tube is noted extending below the diaphragm. Diffuse bilateral airspace opacification may reflect pulmonary edema or pneumonia. ARDS cannot be excluded. Small bilateral pleural effusions are suspected. No pneumothorax is seen. The cardiomediastinal silhouette is normal in size. There is suggestion of a minimally displaced fracture of the left lateral fifth rib. IMPRESSION: 1. Endotracheal tube seen ending 1-2 cm above the carina. This could be retracted 1-2 cm, as deemed clinically appropriate. 2. Right IJ line noted ending about the cavoatrial junction. 3. Diffuse bilateral airspace opacification may reflect pulmonary edema or pneumonia. ARDS cannot be excluded. Small bilateral pleural effusions suspected. 4. Suggestion of a minimally displaced fracture of the left lateral fifth rib. Electronically Signed   By: Roanna Raider M.D.   On: 02/09/2016 23:23   Dg Abd Portable 1v  02/09/2016  CLINICAL DATA:  Feeding tube placement EXAM: PORTABLE ABDOMEN - 1 VIEW COMPARISON:  Portable exam 1812 hours without priors for comparison FINDINGS: Tip of nasogastric tube projects over mid stomach. Tip of feeding tube projects over distal gastric antrum with redundant tubing within stomach. Retained contrast in colon. BILATERAL pulmonary infiltrates. Excreted contrast material within renal collecting systems. IMPRESSION: Tip of feeding tube projects over distal gastric antrum region. Diffuse BILATERAL pulmonary infiltrates.  Electronically Signed   By: Ulyses Southward M.D.   On: 02/09/2016 19:07    Labs:  CBC:  Recent Labs  01/16/2016 2305  WBC 13.3*  HGB 9.4*  HCT 28.9*  PLT 123*    COAGS:  Recent Labs  01/31/2016 2305  INR 1.34  APTT 38*    BMP:  Recent Labs  01/28/2016 2305 02/09/16 1730  NA 137 138  K 4.0 2.9*  CL 104 105  CO2 20* 18*  GLUCOSE 234* 167*  BUN 6 8  CALCIUM 8.1* 8.1*  CREATININE 0.71 0.61  GFRNONAA >60 >60  GFRAA >60 >60    LIVER FUNCTION TESTS:  Recent Labs  02/05/2016 2305  BILITOT 1.1  AST 76*  ALT 30  ALKPHOS 81  PROT 4.8*  ALBUMIN 1.5*    TUMOR MARKERS: No results for input(s): AFPTM, CEA, CA199, CHROMGRNA in the last 8760 hours.  Assessment and Plan:  Acute respiratory failure/ARDS secondary to ETOH  pancreatitis.  Now complicated by significant Rt acute PE and mild Rt heart strain by CTA.  IV heparin just started about 2 hours along with Neo drip.  O/w she is HD stable.  Recs:    1. Echo for EF and confirm Rt heart strain. 2. Check troponins 3.  Head CT to assess for any abnormality or hemorrhage prior to any lytics. 4.  Consider Rt thoracentesis at bedside, which may help ventilate her. 5.  Will follow workup and call if she becomes unstable.  D/w Dr. Sula RumpleA de Dios at the bedside.  Thank you for this interesting consult.  I greatly enjoyed meeting Hardie Loraammy Kerrick and look forward to participating in their care.  A copy of this report was sent to the requesting provider on this date.  Electronically Signed: Berdine DanceSHICK, Dequane Strahan 02/09/2016, 7:19 PM   I spent a total of 20 Minutes    in face to face in clinical consultation, greater than 50% of which was counseling/coordinating care for this pt. With acute PE and miild heart strain.

## 2016-02-09 NOTE — Progress Notes (Signed)
PULMONARY / CRITICAL CARE MEDICINE   Name: Abigail Baker MRN: [id number] DOB: 07/10/1966    ADMISSION DATE:  02/10/2016  CHIEF COMPLAINT:  Pancreatitis  HISTORY OF PRESENT ILLNESS:   50 yo F with h/o DM, Etoh abuse, who presented to [facility] on 01/29/2016 after several weeks of abdominal pain, n/v.  She was found to have most likely EtOH pancreatitis, she developed ARDS and reportedly b/l PNA for which she was treated with abx.  She also developed a pancreatic pseudocyst.  [facility] was [facility] for 24/7 CCM and advanced ventilator management given bouts of hypoxemia despite high PEEP/FiO2  SUBJECTIVE: Patient was transferred from OSH last evening. Patient has having periods of desaturation & bradycardia likely secondary to biting her endotracheal tube with stimulation. Bite block was placed.   REVIEW OF SYSTEMS:  Unable to obtain given intubation & sedation.  VITAL SIGNS: Temp:  [97.2 F (36.2 C)-97.9 F (36.6 C)] 97.7 F (36.5 C) (03/27 0800) Pulse Rate:  [57-103] 72 (03/27 0800) Resp:  [0-33] 9 (03/27 0800) BP: (80-127)/(42-78) 80/53 mmHg (03/27 0800) SpO2:  [91 %-99 %] 96 % (03/27 0800) FiO2 (%):  [80 %-100 %] 80 % (03/27 0800) Weight:  [71.4 kg (157 lb 6.5 oz)-74.9 kg (165 lb 2 oz)] 71.4 kg (157 lb 6.5 oz) (03/27 0500) HEMODYNAMICS:   VENTILATOR SETTINGS: Vent Mode:  [-] PCV FiO2 (%):  [80 %-100 %] 80 % Set Rate:  [16 bmp] 16 bmp PEEP:  [12 cmH20-15 cmH20] 12 cmH20 Plateau Pressure:  [21 cmH20-34 cmH20] 21 cmH20 INTAKE / OUTPUT:  Intake/Output Summary (Last 24 hours) at 02/09/16 1026 Last data filed at 02/09/16 0600  Gross per 24 hour  Intake  289.5 ml  Output   2855 ml  Net -2565.5 ml    PHYSICAL EXAMINATION: General:  Sedated. No distress. Mother & sister at bedside.  Neuro:  Intubated and sedated. Spontaneously moving all 4 extremities.  HEENT:  Endotracheal tube in place. No scleral icterus or injection. Cardiovascular:  Regular rate.  Anasarca. Normal S1 & S2. Lungs:  Course breath sounds bilaterally. Symmetric chest wall rise on ventilator.  Abdomen:  Soft. Nondistended. Hypoactive bowel sounds. Integument:  Warm & Dry. No rash on exposed skin.  LABS:  CBC  Recent Labs Lab 01/22/2016 2305  WBC 13.3*  HGB 9.4*  HCT 28.9*  PLT 123*   Coag's  Recent Labs Lab 01/28/2016 2305  APTT 38*  INR 1.34   BMET  Recent Labs Lab 01/21/2016 2305  NA 137  K 4.0  CL 104  CO2 20*  BUN 6  CREATININE 0.71  GLUCOSE 234*   Electrolytes  Recent Labs Lab 01/18/2016 2305  CALCIUM 8.1*  MG 1.5*  PHOS 3.9   Sepsis Markers  Recent Labs Lab 02/02/2016 2305  LATICACIDVEN 1.6  PROCALCITON 0.14   ABG  Recent Labs Lab 01/31/2016 2231  PHART 7.279*  PCO2ART 41.5  PO2ART 76.7*   Liver Enzymes  Recent Labs Lab 01/16/2016 2305  AST 76*  ALT 30  ALKPHOS 81  BILITOT 1.1  ALBUMIN 1.5*   Cardiac Enzymes No results for input(s): TROPONINI, PROBNP in the last 168 hours. Glucose  Recent Labs Lab 01/22/2016 2150 02/09/16 0014 02/09/16 0349 02/09/16 0830  GLUCAP 201* 252* 188* 139*    Imaging Dg Chest Port 1 View  01/20/2016  CLINICAL DATA:  Endotracheal tube placement.  Initial encounter. EXAM: PORTABLE CHEST 1 VIEW COMPARISON:  None. FINDINGS: The patient's endotracheal tube is seen ending 1-2 cm  above the carina. This could be retracted 1-2 cm, as deemed clinically appropriate. A right IJ line is noted ending about the cavoatrial junction. The patient's enteric tube is noted extending below the diaphragm. Diffuse bilateral airspace opacification may reflect pulmonary edema or pneumonia. ARDS cannot be excluded. Small bilateral pleural effusions are suspected. No pneumothorax is seen. The cardiomediastinal silhouette is normal in size. There is suggestion of a minimally displaced fracture of the left lateral fifth rib. IMPRESSION: 1. Endotracheal tube seen ending 1-2 cm above the carina. This could be retracted 1-2  cm, as deemed clinically appropriate. 2. Right IJ line noted ending about the cavoatrial junction. 3. Diffuse bilateral airspace opacification may reflect pulmonary edema or pneumonia. ARDS cannot be excluded. Small bilateral pleural effusions suspected. 4. Suggestion of a minimally displaced fracture of the left lateral fifth rib. Electronically Signed   By: Roanna Raider M.D.   On: 01/17/2016 23:23   EVENTS: 3/16 - Admit to OSH [facility]) 3/19 - Worsening hypoxia & altered mentation. Transferred to ICU. Failed trial of BiPAP & was intubated. 3/26 - Transfer to [facility]  STUDIES: CT ABD/PELVIS OSH 3/23:  Subacute pancreatitis w/ 6.9x3.6cm pseudocyst in lesser sac & 7mm pseudocyst in tail. Moderate bilateral pleural effusions.  Port CXR 3/26:  Personally reviewed by me. R IJ at cavoatrial junction. ETT 2cm above carina. Bilateral opacities.  MICROBIOLOGY: MRSA PCR 3/26:  Negative   ANTIBIOTICS: None.  LINES/TUBES: OETT [place] 3/19>>> R IJ CVL 3/19 or 3/20>>> OGT>>> Foley>>>  ASSESSMENT / PLAN:  PULMONARY A: ARDS Bilateral Pleural Effusions Tobacco Use  P:   Full Vent Support w/ PRVC 8cc/kg IBW Holding on further diuresis given hypotension Duoneb q6hr Nicotine Patch /24hr ABG at noon  CARDIOVASCULAR A:  Shock - Hypovolemia vs Sepsis  P:  Monitor patient on telemetry Vitals per unit protocol Neo-Synephrine to maintain MAP >65 & SBP >90 Daily EKG on Seroquel  RENAL A:   AGMA Hypomagnesemia - Replacing.  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 Severe Protein-Calorie Malnutrition  P:   NPO Protonix IV q24hr Place post-pyloric Core Track Feeding tube Dietician Consult for Tube Feeding Recommendations  HEMATOLOGIC A:   Anemia  - No signs of active bleeding. S/P previous transfusion for Hgb of 8. Thrombocytopenia - Mild.   P:  Trending cell counts daily w/ CBC Transfuse for Hgb <7.0 Trending cell counts daily w/ CBC Heparin [place] q8hr SCDs  INFECTIOUS A:   No acute issue. On multiple antibiotics previously.  P:   Plan to re-culture for fever Checking Tracheal Aspirate Culture Procalcitonin per algorithm  ENDOCRINE A:   H/O DM Type I  P:   Accu-Checks q4hr  SSI per algorithm Likely will need basal insulin with tube feedings  NEUROLOGIC A:   Sedation on Ventilator H/O EtOH Use  P:   RASS goal: 0 to -1 Fentanly gtt & IV prn Versed IV prn Start Precedex gtt Seroquel  qhs Thiamine & FA IV daily  TODAY'S SUMMARY:  49 yo with acute EtOH pancreatitis and resulting ARDS with anasarca and prolonged hospital course. Holding on further diuresis given hypotension. Optimizing ventilator settings. Adding Seroquel to medications to help with EtOH as well as Precedex drip. Family to further discuss code status.   I have spent a total of  42 minutes of critical care time today caring for the patient, updating family at bedside, and reviewing the electronic medical record as well as outside hospital records.   Donna Christen Jamison Neighbor, M.D. [facility] Pulmonary & Critical Care Pager:  [phone] After 3pm or if no response, call [phone] 02/09/2016, 10:26 AM

## 2016-02-09 NOTE — Significant Event (Signed)
Patient with desaturations in the 50-70s, waveforms questionable. Changed pulse ox several times; patient suctioned, saturation unchanged. Blood gas  attempted along with A Line. Unable to get A line by RRT. Dr. Celene SkeenNester called to bedside. New orders received. Delenn Ahn, RCharity fundraiser

## 2016-02-09 NOTE — Progress Notes (Addendum)
eLink Physician-Brief Progress Note Patient Name: Abigail Baker DOB: 09/15/1966 MRN: [id number]   Date of Service  02/09/2016  HPI/Events of Note  Spoke with Dr. Jamison NeighborNestor about CTA findings. We agree that EKOS of R lung clot is the best way to proceed.  eICU Interventions  I have spoken with the on-call interventional radiologist about EKOS.     Intervention Category Major Interventions: Other:  Abigail Baker,Steven Eugene 02/09/2016, 6:44 PM

## 2016-02-10 ENCOUNTER — Inpatient Hospital Stay (HOSPITAL_COMMUNITY): Payer: BLUE CROSS/BLUE SHIELD

## 2016-02-10 DIAGNOSIS — R079 Chest pain, unspecified: Secondary | ICD-10-CM

## 2016-02-10 DIAGNOSIS — J9601 Acute respiratory failure with hypoxia: Secondary | ICD-10-CM

## 2016-02-10 DIAGNOSIS — I2699 Other pulmonary embolism without acute cor pulmonale: Secondary | ICD-10-CM

## 2016-02-10 DIAGNOSIS — I2601 Septic pulmonary embolism with acute cor pulmonale: Secondary | ICD-10-CM

## 2016-02-10 DIAGNOSIS — K858 Other acute pancreatitis without necrosis or infection: Secondary | ICD-10-CM

## 2016-02-10 LAB — POCT I-STAT 3, ART BLOOD GAS (G3+)
ACID-BASE DEFICIT: 8 mmol/L — AB (ref 0.0–2.0)
Bicarbonate: 17.3 mEq/L — ABNORMAL LOW (ref 20.0–24.0)
O2 SAT: 95 %
PO2 ART: 87 mmHg (ref 80.0–100.0)
Patient temperature: 100
TCO2: 18 mmol/L (ref 0–100)
pCO2 arterial: 37.1 mmHg (ref 35.0–45.0)
pH, Arterial: 7.281 — ABNORMAL LOW (ref 7.350–7.450)

## 2016-02-10 LAB — CBC WITH DIFFERENTIAL/PLATELET
BASOS PCT: 0 %
Basophils Absolute: 0 10*3/uL (ref 0.0–0.1)
EOS ABS: 0.1 10*3/uL (ref 0.0–0.7)
EOS PCT: 1 %
HEMATOCRIT: 29.8 % — AB (ref 36.0–46.0)
HEMOGLOBIN: 9.4 g/dL — AB (ref 12.0–15.0)
Lymphocytes Relative: 10 %
Lymphs Abs: 1.6 10*3/uL (ref 0.7–4.0)
MCH: 30.6 pg (ref 26.0–34.0)
MCHC: 31.5 g/dL (ref 30.0–36.0)
MCV: 97.1 fL (ref 78.0–100.0)
Monocytes Absolute: 0.8 10*3/uL (ref 0.1–1.0)
Monocytes Relative: 4 %
NEUTROS PCT: 85 %
Neutro Abs: 14.7 10*3/uL — ABNORMAL HIGH (ref 1.7–7.7)
Platelets: 166 10*3/uL (ref 150–400)
RBC: 3.07 MIL/uL — ABNORMAL LOW (ref 3.87–5.11)
RDW: 14.8 % (ref 11.5–15.5)
WBC: 17.2 10*3/uL — AB (ref 4.0–10.5)

## 2016-02-10 LAB — COMPREHENSIVE METABOLIC PANEL
ALBUMIN: 1.6 g/dL — AB (ref 3.5–5.0)
ALK PHOS: 87 U/L (ref 38–126)
ALT: 25 U/L (ref 14–54)
ANION GAP: 10 (ref 5–15)
AST: 67 U/L — ABNORMAL HIGH (ref 15–41)
BILIRUBIN TOTAL: 0.5 mg/dL (ref 0.3–1.2)
BUN: 6 mg/dL (ref 6–20)
CALCIUM: 8.1 mg/dL — AB (ref 8.9–10.3)
CO2: 23 mmol/L (ref 22–32)
Chloride: 105 mmol/L (ref 101–111)
Creatinine, Ser: 0.55 mg/dL (ref 0.44–1.00)
GFR calc non Af Amer: >60 mL/min (ref >60)
GLUCOSE: 130 mg/dL — AB (ref 65–99)
POTASSIUM: 4 mmol/L (ref 3.5–5.1)
SODIUM: 138 mmol/L (ref 135–145)
TOTAL PROTEIN: 4.5 g/dL — AB (ref 6.5–8.1)

## 2016-02-10 LAB — GLUCOSE, CAPILLARY
GLUCOSE-CAPILLARY: 137 mg/dL — AB (ref 65–99)
GLUCOSE-CAPILLARY: 162 mg/dL — AB (ref 65–99)
GLUCOSE-CAPILLARY: 137 mg/dL — AB (ref 65–99)
Glucose-Capillary: 113 mg/dL — ABNORMAL HIGH (ref 65–99)
Glucose-Capillary: 155 mg/dL — ABNORMAL HIGH (ref 65–99)
Glucose-Capillary: 150 mg/dL — ABNORMAL HIGH (ref 65–99)

## 2016-02-10 LAB — HEPARIN LEVEL (UNFRACTIONATED)
HEPARIN UNFRACTIONATED: 0.66 [IU]/mL (ref 0.30–0.70)
Heparin Unfractionated: 0.68 IU/mL (ref 0.30–0.70)

## 2016-02-10 LAB — PHOSPHORUS: Phosphorus: 3.3 mg/dL (ref 2.5–4.6)

## 2016-02-10 LAB — TROPONIN I
TROPONIN I: 0.09 ng/mL — AB (ref <0.031)
Troponin I: 0.06 ng/mL — ABNORMAL HIGH (ref <0.031)

## 2016-02-10 LAB — MAGNESIUM: MAGNESIUM: 2.1 mg/dL (ref 1.7–2.4)

## 2016-02-10 LAB — ECHOCARDIOGRAM COMPLETE
HEIGHTINCHES: 61 in
WEIGHTICAEL: 2472.68 [oz_av]

## 2016-02-10 MED ORDER — SODIUM CHLORIDE 0.9 % IV BOLUS (SEPSIS)
1000.0000 mL | Freq: Once | INTRAVENOUS | Status: AC
  Administered 2016-02-10: 1000 mL via INTRAVENOUS

## 2016-02-10 MED ORDER — GERHARDT'S BUTT CREAM
TOPICAL_CREAM | Freq: Two times a day (BID) | CUTANEOUS | Status: DC
  Administered 2016-02-10 – 2016-02-11 (×2): via TOPICAL
  Administered 2016-02-11: 1 via TOPICAL
  Administered 2016-02-12: 14:00:00 via TOPICAL
  Administered 2016-02-12: 1 via TOPICAL
  Administered 2016-02-14 – 2016-02-15 (×2): via TOPICAL
  Filled 2016-02-10: qty 1

## 2016-02-10 MED ORDER — SODIUM CHLORIDE 0.9% FLUSH: 3.0000 mL | INTRAVENOUS | Status: DC | PRN

## 2016-02-10 MED ORDER — SODIUM CHLORIDE 0.9 % IV SOLN
INTRAVENOUS | Status: DC
  Administered 2016-02-10: 23:00:00 via INTRAVENOUS

## 2016-02-10 MED ORDER — SODIUM CHLORIDE 0.9 % IV SOLN: 250.0000 mL | INTRAVENOUS | Status: DC | PRN

## 2016-02-10 MED ORDER — MIDAZOLAM HCL 2 MG/2ML IJ SOLN
INTRAMUSCULAR | Status: AC | PRN
  Administered 2016-02-10: 1 mg via INTRAVENOUS

## 2016-02-10 MED ORDER — PANTOPRAZOLE SODIUM 40 MG PO PACK
40.0000 mg | PACK | Freq: Every day | ORAL | Status: DC
  Administered 2016-02-10 – 2016-02-14 (×5): 40 mg
  Filled 2016-02-10 (×6): qty 20

## 2016-02-10 MED ORDER — SODIUM CHLORIDE 0.9% FLUSH
3.0000 mL | Freq: Two times a day (BID) | INTRAVENOUS | Status: DC
  Administered 2016-02-11: 3 mL via INTRAVENOUS

## 2016-02-10 MED ORDER — SODIUM CHLORIDE 0.9 % IV SOLN
INTRAVENOUS | Status: DC
  Administered 2016-02-10: 17:00:00 via INTRAVENOUS

## 2016-02-10 MED ORDER — ACETAMINOPHEN 160 MG/5ML PO SOLN
650.0000 mg | ORAL | Status: DC | PRN
  Administered 2016-02-10: 650 mg
  Filled 2016-02-10: qty 20.3

## 2016-02-10 MED ORDER — LIDOCAINE HCL 1 % IJ SOLN
INTRAMUSCULAR | Status: AC
  Filled 2016-02-10: qty 20

## 2016-02-10 MED ORDER — SODIUM CHLORIDE 0.9 % IV SOLN
1.0000 mg/h | Freq: Once | INTRAVENOUS | Status: DC
  Administered 2016-02-10: 1 mg/h via INTRAVENOUS
  Filled 2016-02-10: qty 24

## 2016-02-10 NOTE — Sedation Documentation (Signed)
Patient is resting comfortably. 

## 2016-02-10 NOTE — Progress Notes (Signed)
PCCM Interval Progress Note  Events:  Hypotension (SBP high 80's) requiring up titration of neo-synephrine.  Also febrile to 101.  Interventions:  1L NS bolus.  PCT algorithm ordered, empiric vanc / zosyn, blood & urine cultures sent (sputum pending already).   Abigail Baker, [place] - [place] Pulmonary & Critical Care Medicine Pager: [phone]  or [phone] 02/10/2016, 11:36 PM

## 2016-02-10 NOTE — Procedures (Signed)
Interventional Radiology Procedure Note  Procedure:  1.) Attempted right thoracentesis - insufficient fluid for drainage.  2.) Right pulmonary angiogram and initiation of thrombolysis  Complications: None  Estimated Blood Loss: 0   Recommendations: - Unilateral PE lysis per protocol  Signed,  Sterling BigHeath K. Porsha Skilton, MD

## 2016-02-10 NOTE — Consult Note (Signed)
Chief Complaint: Patient was seen in consultation today for Pulmonary embolus thrombolysis with Right thoracentesis at the request of Dr Lawanda Cousins  Referring Physician(s): Dr Lawanda Cousins  Supervising Physician: Malachy Moan  History of Present Illness: Abigail Baker is a 50 y.o. female   ETOH pancreatitis Presented to [facility] abd pain/N/V 01/29/16 Diagnosed with pancreatitis Developed acute respiratory distress and pneumonia Transferred to [facility] for ventilator management after continued hypoxia CTA revealed B pulmonary embolus with Rt heart strain with Rt pleural effusion Small pulmonary embolus is noted peripherally in lower lobe branch of left pulmonary artery. Large central embolus is noted in the right pulmonary artery. Positive for acute PE with CT evidence of right heart strain (RV/LV Ratio = 1.1) Now on Heparin Septic shock CT head negative Troponin only minimally elevated BNP 777 EKG sinus tachy Request made for consideration of EKOS thrombolysis Dr Miles Costain saw pt last pm ---see consult note Dr Archer Asa has reviewed all imaging and has approved procedure  No past medical history on file.  No past surgical history on file.  Allergies: Review of patient's allergies indicates no known allergies.  Medications: Prior to Admission medications   Medication Sig Start Date End Date Taking? Authorizing Provider  acetaminophen (TYLENOL) 325 MG tablet Take 650 mg by mouth every 6 (six) hours as needed for mild pain or fever (fever above 100.4).   Yes Historical Provider, MD  acetaminophen (TYLENOL) 650 MG suppository Place 650 mg rectally every 6 (six) hours as needed for mild pain or fever (above 100.4).   Yes Historical Provider, MD  alum & mag hydroxide-simeth (MAALOX/MYLANTA) 200-200-20 MG/5ML suspension Take 30 mLs by mouth every 4 (four) hours as needed for indigestion or heartburn.   Yes Historical Provider, MD  dicyclomine (BENTYL) 20 MG tablet  Take 20 mg by mouth every 6 (six) hours. 01/19/16  Yes Historical Provider, MD  docusate sodium (COLACE) 100 MG capsule Take 100 mg by mouth daily as needed for mild constipation.   Yes Historical Provider, MD  enalapril (VASOTEC) 5 MG tablet Take 2.5 mg by mouth every morning. 01/20/16  Yes Historical Provider, MD  enoxaparin (LOVENOX) 40 MG/0.4ML injection Inject 40 mg into the skin daily.   Yes Historical Provider, MD  GLUCERNA (GLUCERNA) LIQD Give 1,000 mLs by tube daily. Start OGT feedin at 59mL/hr, do not titrate   Yes Historical Provider, MD  guaiFENesin-dextromethorphan (ROBITUSSIN DM) 100-10 MG/5ML syrup Take 10 mLs by mouth every 6 (six) hours as needed for cough.   Yes Historical Provider, MD  insulin regular (NOVOLIN R,HUMULIN R) 100 units/mL injection Inject into the skin every 6 (six) hours. Sliding Scale   Yes Historical Provider, MD  levothyroxine (SYNTHROID, LEVOTHROID) 125 MCG tablet Take 125 mcg by mouth daily before breakfast.   Yes Historical Provider, MD  lipase/protease/amylase (CREON) 12000 units CPEP capsule Take 24,000 Units by mouth 3 (three) times daily with meals.   Yes Historical Provider, MD  LORazepam (ATIVAN) 2 MG/ML injection Inject 2 mg into the vein every 4 (four) hours as needed (withdrawal score 10-12).   Yes Historical Provider, MD  magnesium hydroxide (MILK OF MAGNESIA) 400 MG/5ML suspension Take 30 mLs by mouth daily as needed for mild constipation.   Yes Historical Provider, MD  ondansetron (ZOFRAN) 40 MG/20ML SOLN injection Inject 4 mg into the vein every 6 (six) hours as needed for nausea or vomiting.   Yes Historical Provider, MD  pantoprazole (PROTONIX) 40 MG injection Inject 40 mg into the vein  daily.   Yes Historical Provider, MD  Prenatal Vit-Fe Fumarate-FA (PRENATAL MULTIVITAMIN) TABS tablet Take 1 tablet by mouth daily at 12 noon.   Yes Historical Provider, MD  promethazine (PHENERGAN) 25 MG/ML injection Inject 12.5 mg into the vein every 6 (six) hours as  needed for nausea or vomiting.   Yes Historical Provider, MD  propranolol (INDERAL) 10 MG tablet Take 10 mg by mouth 2 (two) times daily.   Yes Historical Provider, MD  ranitidine (ZANTAC) 150 MG tablet Take 150 mg by mouth 2 (two) times daily. 01/28/16  Yes Historical Provider, MD  sertraline (ZOLOFT) 100 MG tablet Take 200 mg by mouth daily. 01/20/16  Yes Historical Provider, MD  temazepam (RESTORIL) 15 MG capsule Take 15 mg by mouth at bedtime as needed for sleep (insomnia).   Yes Historical Provider, MD  thiamine (VITAMIN B-1) 100 MG tablet Take 100 mg by mouth daily.   Yes Historical Provider, MD     No family history on file.  Social History   Social History  . Marital Status: Divorced    Spouse Name: N/A  . Number of Children: N/A  . Years of Education: N/A   Social History Main Topics  . Smoking status: Not on file  . Smokeless tobacco: Not on file  . Alcohol Use: Not on file  . Drug Use: Not on file  . Sexual Activity: Not on file   Other Topics Concern  . Not on file   Social History Narrative  . No narrative on file     Review of Systems: A 12 point ROS discussed and pertinent positives are indicated in the HPI above.  All other systems are negative.  Review of Systems  Constitutional: Positive for activity change. Negative for fever.  Respiratory:       Vent; intubated    Vital Signs: BP 122/64 mmHg  Pulse 87  Temp(Src) 100 F (37.8 C) (Axillary)  Resp 20  Ht 5\' 1"  (1.549 m)  Wt 154 lb 8.7 oz (70.1 kg)  BMI 29.22 kg/m2  SpO2 89%  LMP  (LMP Unknown)  Physical Exam  Cardiovascular: Normal rate.   Pulmonary/Chest:  vent  Abdominal: Bowel sounds are normal.  Musculoskeletal:  Intubated/sedated Follow NO commands No response  Psychiatric:  Consented with mother at bedside  Nursing note and vitals reviewed.   Mallampati Score:  MD Evaluation Airway: Other (comments) Airway comments: vent Heart: WNL Abdomen: WNL Chest/ Lungs: Other  (comments) Chest/ lungs comments: vent ASA  Classification: 3 Mallampati/Airway Score: Three  Imaging: Ct Angio Chest Pe W/cm &/or Wo Cm  02/09/2016  CLINICAL DATA:  Acute respiratory failure with hypoxia. EXAM: CT ANGIOGRAPHY CHEST WITH CONTRAST TECHNIQUE: Multidetector CT imaging of the chest was performed using the standard protocol during bolus administration of intravenous contrast. Multiplanar CT image reconstructions and MIPs were obtained to evaluate the vascular anatomy. CONTRAST:  80 mL of Isovue 370 intravenously. COMPARISON:  None. FINDINGS: No pneumothorax is noted. Endotracheal tube is in grossly good position. Moderate bilateral pleural effusions are noted. Diffuse bilateral lung opacities are noted concerning for pneumonia or edema. There is no evidence of thoracic aortic dissection or aneurysm. Small embolus is noted in lower lobe branch of left pulmonary artery. Large embolus is noted in the right pulmonary artery which extends into upper and lower lobe branches. Moderate anasarca is noted. RV/LV ratio of 1.1 is noted. Multiple old left rib fractures are noted. Review of the MIP images confirms the above findings. IMPRESSION: Diffuse  bilateral lung opacities are noted concerning for edema or pneumonia. Moderate bilateral pleural effusions are noted. Moderate anasarca is noted. Small pulmonary embolus is noted peripherally in lower lobe branch of left pulmonary artery. Large central embolus is noted in the right pulmonary artery. Positive for acute PE with CT evidence of right heart strain (RV/LV Ratio = 1.1) consistent with at least submassive (intermediate risk) PE. The presence of right heart strain has been associated with an increased risk of morbidity and mortality. Please activate Code PE by paging [phone]. Critical Value/emergent results were called by telephone at the time of interpretation on 02/09/2016 at 5:05 pm to [facility] Ksor, patient's nurse, who verbally acknowledged these  results and will immediately contact the ordering physician. Electronically Signed   By: Lupita Raider, M.D.   On: 02/09/2016 17:06   Dg Chest Port 1 View  02/09/2016  CLINICAL DATA:  Acute respiratory failure with hypoxia. EXAM: PORTABLE CHEST 1 VIEW COMPARISON:  Chest x-ray dated 16-Feb-2016. FINDINGS: Endotracheal tube remains well positioned with tip just above the level of the carina. Enteric tube passes below the diaphragm. Right IJ central line is well positioned with tip at the level of the lower SVC. Heart size is normal. Overall cardiomediastinal silhouette is stable in size and configuration. The diffuse bilateral pulmonary edema pattern is stable, perhaps slightly improved aeration bilaterally. Dense opacities at each lung base are unchanged, most likely atelectasis and/or small effusions. IMPRESSION: Stable exam with diffuse bilateral pulmonary edema pattern versus ARDS, perhaps mildly improved aeration bilaterally. Persistent bibasilar atelectasis and/or small effusions. Tubes and lines remain well-positioned. A left lateral rib fracture was suspected on yesterday's exam, better seen on the previous chest x-ray. Electronically Signed   By: Bary Richard M.D.   On: 02/09/2016 13:54   Dg Chest Port 1 View  Feb 16, 2016  CLINICAL DATA:  Endotracheal tube placement.  Initial encounter. EXAM: PORTABLE CHEST 1 VIEW COMPARISON:  None. FINDINGS: The patient's endotracheal tube is seen ending 1-2 cm above the carina. This could be retracted 1-2 cm, as deemed clinically appropriate. A right IJ line is noted ending about the cavoatrial junction. The patient's enteric tube is noted extending below the diaphragm. Diffuse bilateral airspace opacification may reflect pulmonary edema or pneumonia. ARDS cannot be excluded. Small bilateral pleural effusions are suspected. No pneumothorax is seen. The cardiomediastinal silhouette is normal in size. There is suggestion of a minimally displaced fracture of the left  lateral fifth rib. IMPRESSION: 1. Endotracheal tube seen ending 1-2 cm above the carina. This could be retracted 1-2 cm, as deemed clinically appropriate. 2. Right IJ line noted ending about the cavoatrial junction. 3. Diffuse bilateral airspace opacification may reflect pulmonary edema or pneumonia. ARDS cannot be excluded. Small bilateral pleural effusions suspected. 4. Suggestion of a minimally displaced fracture of the left lateral fifth rib. Electronically Signed   By: Roanna Raider M.D.   On: 16-Feb-2016 23:23   Dg Abd Portable 1v  02/09/2016  CLINICAL DATA:  Feeding tube placement EXAM: PORTABLE ABDOMEN - 1 VIEW COMPARISON:  Portable exam 1812 hours without priors for comparison FINDINGS: Tip of nasogastric tube projects over mid stomach. Tip of feeding tube projects over distal gastric antrum with redundant tubing within stomach. Retained contrast in colon. BILATERAL pulmonary infiltrates. Excreted contrast material within renal collecting systems. IMPRESSION: Tip of feeding tube projects over distal gastric antrum region. Diffuse BILATERAL pulmonary infiltrates. Electronically Signed   By: Ulyses Southward M.D.   On: 02/09/2016 19:07   Ct  Portable Head W/o Cm  02/10/2016  CLINICAL DATA:  Acute encephalopathy EXAM: CT HEAD WITHOUT CONTRAST TECHNIQUE: Contiguous axial images were obtained from the base of the skull through the vertex without intravenous contrast. COMPARISON:  None. FINDINGS: Bony calvarium is intact. Endotracheal tube and nasogastric catheter are noted. No findings to suggest acute hemorrhage are seen. No acute infarct is noted. IMPRESSION: No acute abnormality is noted at this time. Electronically Signed   By: Alcide Clever M.D.   On: 02/10/2016 11:29    Labs:  CBC:  Recent Labs  01/23/2016 2305 02/10/16 0038  WBC 13.3* 17.2*  HGB 9.4* 9.4*  HCT 28.9* 29.8*  PLT 123* 166    COAGS:  Recent Labs  02/07/2016 2305  INR 1.34  APTT 38*    BMP:  Recent Labs  01/20/2016 2305  02/09/16 1730 02/10/16 0038  NA 137 138 138  K 4.0 2.9* 4.0  CL 104 105 105  CO2 20* 18* 23  GLUCOSE 234* 167* 130*  BUN CALCIUM 8.1* 8.1* 8.1*  CREATININE 0.71 0.61 0.55  GFRNONAA >60 >60 >60  GFRAA >60 >60 >60    LIVER FUNCTION TESTS:  Recent Labs  02/07/2016 2305 02/10/16 0038  BILITOT 1.1 0.5  AST 76* 67*  ALT 30 25  ALKPHOS 81 87  PROT 4.8* 4.5*  ALBUMIN 1.5* 1.6*    TUMOR MARKERS: No results for input(s): AFPTM, CEA, CA199, CHROMGRNA in the last 8760 hours.  Assessment and Plan:  B PE with Rt heart strain Pleural effusion Hypoxia; ARDS Scheduled for US guided EKOS thrombolysis Bilateral pulmonary emboli; to include Right thoracentesis Risks and Benefits discussed with the patient's mother including, but not limited to bleeding, possible life threatening bleeding and need for blood product transfusion, vascular injury, stroke, contrast induced renal failure, and infection. All of her questions were answered, she is agreeable to proceed. Consent signed and in chart.   Thank you for this interesting consult.  I greatly enjoyed meeting Noell Lorensen and look forward to participating in their care.  A copy of this report was sent to the requesting provider on this date.  Electronically Signed: Ralene Muskrat A 02/10/2016, 12:54 PM   I spent a total of 40 Minutes    in face to face in clinical consultation, greater than 50% of which was counseling/coordinating care for PE thrombolysis

## 2016-02-10 NOTE — Significant Event (Signed)
New bags of fentanyl and versed given to and handed off report to SunsitesKatrice, charge RN in [facility] as patient is being transferred there after IR.  Patient's only belonging is a pillow which is now in patient's new room in [facility]. Family (patient's mother) updated and is in the waiting area in IR.

## 2016-02-10 NOTE — Progress Notes (Signed)
ANTICOAGULATION CONSULT NOTE - Follow Up Consult  Pharmacy Consult for heparin Indication: pulmonary embolus  Labs:  Recent Labs  01/28/2016 2305 02/09/16 1730 02/09/16 1940 02/10/16 0038  HGB 9.4*  --   --  9.4*  HCT 28.9*  --   --  29.8*  PLT 123*  --   --  166  APTT 38*  --   --   --   LABPROT 16.7*  --   --   --   INR 1.34  --   --   --   HEPARINUNFRC  --   --   --  0.66  CREATININE 0.71 0.61  --   --   TROPONINI  --   --  0.10*  --      Assessment/Plan:  50yo female therapeutic on heparin with initial dosing for PE. Will continue gtt at current rate and confirm stable with additional level.   Vernard GamblesVeronda Rosella Crandell, PharmD, BCPS  02/10/2016,1:05 AM

## 2016-02-10 NOTE — Progress Notes (Signed)
Called CCM to inform about brady in the 40s and titrating up to 180 of neo from 90 in less than 20 mins. Pt. with a slight fever needing PRNs. Melina Schoolshompson, Albeiro Trompeter E, [place]RN 02/10/2016 9:27 PM

## 2016-02-10 NOTE — Sedation Documentation (Signed)
Right pulmonary 42/30 (30)

## 2016-02-10 NOTE — Sedation Documentation (Signed)
Vital signs stable. 

## 2016-02-10 NOTE — Progress Notes (Signed)
PULMONARY / CRITICAL CARE MEDICINE   Name: Abigail Baker MRN: [id number] DOB: Dec 03, 1965    ADMISSION DATE:  01/28/2016  CHIEF COMPLAINT:  Pancreatitis  HISTORY OF PRESENT ILLNESS:   50 yo F with h/o DM, Etoh abuse, who presented to [facility] on 01/29/2016 after several weeks of abdominal pain, n/v.  She was found to have most likely EtOH pancreatitis, she developed ARDS and reportedly b/l PNA for which she was treated with abx.  She also developed a pancreatic pseudocyst.  She was transferred to [facility] hospital for 24/7 CCM and advanced ventilator management given bouts of hypoxemia despite high PEEP/FiO2  SUBJECTIVE: Acute worsening of hypoxia yesterday and CTA showing bilateral PE. Started on heparin drip & consulted IR for possible EKOS.  REVIEW OF SYSTEMS:  Unable to obtain given intubation & sedation.  VITAL SIGNS: Temp:  [96.8 F (36 C)-100 F (37.8 C)] 100 F (37.8 C) (03/28 0758) Pulse Rate:  [42-108] 100 (03/28 0830) Resp:  [0-28] 9 (03/28 0830) BP: (67-136)/(39-114) 76/56 mmHg (03/28 0830) SpO2:  [52 %-100 %] 98 % (03/28 0830) Arterial Line BP: (82-145)/(43-81) 94/50 mmHg (03/28 0830) FiO2 (%):  [90 %-100 %] 90 % (03/28 0830) Weight:  [70.1 kg (154 lb 8.7 oz)] 70.1 kg (154 lb 8.7 oz) (03/28 0500) HEMODYNAMICS:   VENTILATOR SETTINGS: Vent Mode:  [-] PCV FiO2 (%):  [90 %-100 %] 90 % Set Rate:  [16 bmp] 16 bmp Vt Set:  [380 mL] 380 mL PEEP:  [14 cmH20] 14 cmH20 Plateau Pressure:  [11 cmH20-46 cmH20] 46 cmH20 INTAKE / OUTPUT:  Intake/Output Summary (Last 24 hours) at 02/10/16 0910 Last data filed at 02/10/16 0715  Gross per 24 hour  Intake 2063.61 ml  Output   1380 ml  Net 683.61 ml    PHYSICAL EXAMINATION: General:  Sedated. No distress. No family at bedside.  Neuro:  Intubated and sedated. Pupils equal. HEENT:  Endotracheal tube in place. No scleral icterus or injection. Mild scleral edema. Cardiovascular:  Regular rate. Anasarca. Normal S1 & S2. Lungs:   Course breath sounds bilaterally. Symmetric chest wall rise on ventilator.  Abdomen:  Soft. Nondistended. Hypoactive bowel sounds persist. Integument:  Warm & Dry. No rash on exposed skin.  LABS:  CBC  Recent Labs Lab 01/24/2016 2305 02/10/16 0038  WBC 13.3* 17.2*  HGB 9.4* 9.4*  HCT 28.9* 29.8*  PLT 123* 166   Coag's  Recent Labs Lab 02/13/2016 2305  APTT 38*  INR 1.34   BMET  Recent Labs Lab 01/25/2016 2305 02/09/16 1730 02/10/16 0038  NA 137 138 138  K 4.0 2.9* 4.0  CL 104 105 105  CO2 20* 18* 23  BUN CREATININE 0.71 0.61 0.55  GLUCOSE 234* 167* 130*   Electrolytes  Recent Labs Lab 01/26/2016 2305 02/09/16 1730 02/10/16 0038  CALCIUM 8.1* 8.1* 8.1*  MG 1.5* 2.1 2.1  PHOS 3.9  --  3.3   Sepsis Markers  Recent Labs Lab 01/22/2016 2305  LATICACIDVEN 1.6  PROCALCITON 0.14   ABG  Recent Labs Lab 02/09/16 1311 02/09/16 1847 02/10/16 0818  PHART 7.244* 7.300* 7.281*  PCO2ART 49.4* 44.5 37.1  PO2ART 40.0* 58.0* 87.0   Liver Enzymes  Recent Labs Lab 02/13/2016 2305 02/10/16 0038  AST 76* 67*  ALT 30 25  ALKPHOS 81 87  BILITOT 1.1 0.5  ALBUMIN 1.5* 1.6*   Cardiac Enzymes  Recent Labs Lab 02/09/16 1940 02/10/16 0038 02/10/16 0634  TROPONINI 0.10* 0.09* 0.06*   Glucose  Recent Labs Lab 02/09/16 1227 02/09/16 1613 02/09/16 1936 02/10/16 0045 02/10/16 0351 02/10/16 0753  GLUCAP 186* 176* 148* 113* 155* 137*    Imaging Ct Angio Chest Pe W/cm &/or Wo Cm  02/09/2016  CLINICAL DATA:  Acute respiratory failure with hypoxia. EXAM: CT ANGIOGRAPHY CHEST WITH CONTRAST TECHNIQUE: Multidetector CT imaging of the chest was performed using the standard protocol during bolus administration of intravenous contrast. Multiplanar CT image reconstructions and MIPs were obtained to evaluate the vascular anatomy. CONTRAST:  80 mL of Isovue 370 intravenously. COMPARISON:  None. FINDINGS: No pneumothorax is noted. Endotracheal tube is in grossly good  position. Moderate bilateral pleural effusions are noted. Diffuse bilateral lung opacities are noted concerning for pneumonia or edema. There is no evidence of thoracic aortic dissection or aneurysm. Small embolus is noted in lower lobe branch of left pulmonary artery. Large embolus is noted in the right pulmonary artery which extends into upper and lower lobe branches. Moderate anasarca is noted. RV/LV ratio of 1.1 is noted. Multiple old left rib fractures are noted. Review of the MIP images confirms the above findings. IMPRESSION: Diffuse bilateral lung opacities are noted concerning for edema or pneumonia. Moderate bilateral pleural effusions are noted. Moderate anasarca is noted. Small pulmonary embolus is noted peripherally in lower lobe branch of left pulmonary artery. Large central embolus is noted in the right pulmonary artery. Positive for acute PE with CT evidence of right heart strain (RV/LV Ratio = 1.1) consistent with at least submassive (intermediate risk) PE. The presence of right heart strain has been associated with an increased risk of morbidity and mortality. Please activate Code PE by paging [phone]. Critical Value/emergent results were called by telephone at the time of interpretation on 02/09/2016 at 5:05 pm to [facility], patient's nurse, who verbally acknowledged these results and will immediately contact the ordering physician. Electronically Signed   By: Lupita Raider, M.D.   On: 02/09/2016 17:06   Dg Chest Port 1 View  02/09/2016  CLINICAL DATA:  Acute respiratory failure with hypoxia. EXAM: PORTABLE CHEST 1 VIEW COMPARISON:  Chest x-ray dated 03-05-16. FINDINGS: Endotracheal tube remains well positioned with tip just above the level of the carina. Enteric tube passes below the diaphragm. Right IJ central line is well positioned with tip at the level of the lower SVC. Heart size is normal. Overall cardiomediastinal silhouette is stable in size and configuration. The diffuse  bilateral pulmonary edema pattern is stable, perhaps slightly improved aeration bilaterally. Dense opacities at each lung base are unchanged, most likely atelectasis and/or small effusions. IMPRESSION: Stable exam with diffuse bilateral pulmonary edema pattern versus ARDS, perhaps mildly improved aeration bilaterally. Persistent bibasilar atelectasis and/or small effusions. Tubes and lines remain well-positioned. A left lateral rib fracture was suspected on yesterday's exam, better seen on the previous chest x-ray. Electronically Signed   By: Bary Richard M.D.   On: 02/09/2016 13:54   Dg Abd Portable 1v  02/09/2016  CLINICAL DATA:  Feeding tube placement EXAM: PORTABLE ABDOMEN - 1 VIEW COMPARISON:  Portable exam 1812 hours without priors for comparison FINDINGS: Tip of nasogastric tube projects over mid stomach. Tip of feeding tube projects over distal gastric antrum with redundant tubing within stomach. Retained contrast in colon. BILATERAL pulmonary infiltrates. Excreted contrast material within renal collecting systems. IMPRESSION: Tip of feeding tube projects over distal gastric antrum region. Diffuse BILATERAL pulmonary infiltrates. Electronically Signed   By: Ulyses Southward M.D.   On: 02/09/2016 19:07   EVENTS: 3/16 - Admit  to [facility]([place] 3/19 - Worsening hypoxia & altered mentation. Transferred to ICU. Failed trial of BiPAP & was intubated. 3/26 - [facility]  STUDIES: CT ABD/PELVIS OSH 3/23:  Subacute pancreatitis w/ 6.9x3.6cm pseudocyst in lesser sac & 7mm pseudocyst in tail. Moderate bilateral pleural effusions.  Port CXR 3/26:  Personally reviewed by me. R IJ at cavoatrial junction. ETT 2cm above carina. Bilateral opacities. CTA Chest 3/27:  Moderate bilateral pleural effusions. Diffuse lung opacities. Small PE left lower lobe branch & large central PE right pulmonary artery w/ RV/LV ratio 1.1. KUB 3/27:  Feeding tube in stomach.  MICROBIOLOGY: MRSA PCR 3/26:  Negative    ANTIBIOTICS: None.  LINES/TUBES: OETT [place] 3/19>>> R IJ CVL 3/19 or 3/20>>> OGT>>> Foley>>> R Radial Art Line 3/27>>>  ASSESSMENT / PLAN:  PULMONARY A: Bilateral PE - Large R Central PE. ARDS Bilateral Pleural Effusions Tobacco Use  P:   Full Vent Support w/ PC Holding on further diuresis given hypotension Duoneb q6hr Nicotine Patch 7mg /24hr  CARDIOVASCULAR A:  Shock - Hypovolemia vs Sepsis vs PE Anasarca  P:  Monitor patient on telemetry Vitals per unit protocol Neo-Synephrine to maintain MAP >65 & SBP >90 Daily EKG on Seroquel TTE pending  RENAL A:   NonAGMA AGMA - Resolved. Hypomagnesemia - Resolved.  P:   Trending urine output w/ Foley Monitoring renal function & electrolytes daily Replacing electrolytes as indicated  GASTROINTESTINAL A:   Acute EtOH Pancreatitis Pancreatic Pseudocysts - x2 per OSH CT. Severe Protein-Calorie Malnutrition  P:   NPO Protonix IV q24hr Place post-pyloric Core Track Feeding tube Dietician Consult for Tube Feeding Recommendations  HEMATOLOGIC A:   PE Anemia - No signs of active bleeding. S/P previous transfusion for Hgb of 8. Leukocytosis - Increasing.  Thrombocytopenia - Resolved.   P:  Heparin drip per pharmacy protocol Trending cell counts daily w/ CBC Transfuse for Hgb <7.0  INFECTIOUS A:   No acute issue. On multiple antibiotics previously.  P:   Plan to re-culture for fever Awaiting Trach Asp Ctx Procalcitonin per algorithm  ENDOCRINE A:   H/O DM Type I  P:   Accu-Checks q4hr  SSI per algorithm Likely will need basal insulin with tube feedings  NEUROLOGIC A:   Sedation on Ventilator H/O EtOH Use  P:   RASS goal: 0 to -1 Fentanly gtt & IV prn Versed gtt & IV prn Seroquel 50mg  qhs Thiamine & FA IV daily Portable Head CT   TODAY'S SUMMARY:  50 yo with acute EtOH pancreatitis and resulting ARDS with anasarca and prolonged hospital course. Has large right PE. TTE pending to  evaluate for right heart strain but BNP elevated & Troponin I mildly elevated as well. Checking stat CT head portable & possible EKOS with IR pending this result.   I have spent a total of 37 minutes of critical care time today caring for the patient and reviewing the electronic medical record as well as outside hospital records.   Donna ChristenJennings E. Jamison NeighborNestor, M.D. [facility] Pager:  [phone] After 3pm or if no response, call [phone] 02/10/2016, 9:10 AM

## 2016-02-10 NOTE — Progress Notes (Signed)
ANTICOAGULATION CONSULT NOTE - Follow Up Consult  Pharmacy Consult for Heparin Indication: pulmonary embolus  No Known Allergies  Patient Measurements: Height: 5\' 1"  (154.9 cm) Weight: 154 lb 8.7 oz (70.1 kg) IBW/kg (Calculated) : 47.8 Heparin Dosing Weight: 62 kg  Vital Signs: Temp: 100 F (37.8 C) (03/28 0758) Temp Source: Axillary (03/28 0758) BP: 76/56 mmHg (03/28 0830) Pulse Rate: 100 (03/28 0830)  Labs:  Recent Labs  13-Aug-2016 2305 02/09/16 1730 02/09/16 1940 02/10/16 0038 02/10/16 0633 02/10/16 0634  HGB 9.4*  --   --  9.4*  --   --   HCT 28.9*  --   --  29.8*  --   --   PLT 123*  --   --  166  --   --   APTT 38*  --   --   --   --   --   LABPROT 16.7*  --   --   --   --   --   INR 1.34  --   --   --   --   --   HEPARINUNFRC  --   --   --  0.66 0.68  --   CREATININE 0.71 0.61  --  0.55  --   --   TROPONINI  --   --  0.10* 0.09*  --  0.06*    Estimated Creatinine Clearance: 77.8 mL/min (by C-G formula based on Cr of 0.55).   Assessment: [id number] YOF who [facility] with likely EtOH pancreatitis and ARDS for ventilator management.  Anticoagulation: small PE in LLL branch; large PE in rPA - submassive PE. IV heparin started 3/27 PM. HL 0.66, 0.68 both in goal range. Hgb 9.4 stable. Plts ok.   Goal of Therapy:  Heparin level 0.3-0.7 units/ml Monitor platelets by anticoagulation protocol: Yes   Plan:  d/c SQ heparin Continue Iv heparin at 1000 units/hr Daily HL and CBC F/u radiology to decide on PE lysis F/u Echo, head CT   Aniyiah Zell S. Merilynn Finlandobertson, PharmD, BCPS Clinical Staff Pharmacist Pager [phone]  Misty Stanleyobertson, Marvie Brevik Stillinger 02/10/2016,9:07 AM

## 2016-02-10 NOTE — Progress Notes (Signed)
Echocardiogram 2D Echocardiogram has been performed.  Abigail Baker, Abigail Baker 02/10/2016, 1:34 PM

## 2016-02-10 NOTE — Significant Event (Signed)
Feeding tube advanced by cortrak team, KUB obtained. Spoke to Dr. Jamison NeighborNestor regarding this. MD wants post pyroric and is okay to hold tube feeds today and to re-address it in the AM.

## 2016-02-10 NOTE — Progress Notes (Signed)
Pt transported to & from IR on 100% via ventilator. No complications noted.

## 2016-02-11 ENCOUNTER — Inpatient Hospital Stay (HOSPITAL_COMMUNITY): Payer: BLUE CROSS/BLUE SHIELD

## 2016-02-11 DIAGNOSIS — I2601 Septic pulmonary embolism with acute cor pulmonale: Secondary | ICD-10-CM | POA: Insufficient documentation

## 2016-02-11 DIAGNOSIS — I2782 Chronic pulmonary embolism: Secondary | ICD-10-CM | POA: Insufficient documentation

## 2016-02-11 DIAGNOSIS — G9340 Encephalopathy, unspecified: Secondary | ICD-10-CM | POA: Insufficient documentation

## 2016-02-11 DIAGNOSIS — J80 Acute respiratory distress syndrome: Secondary | ICD-10-CM | POA: Insufficient documentation

## 2016-02-11 LAB — BASIC METABOLIC PANEL
ANION GAP: 9 (ref 5–15)
BUN: 7 mg/dL (ref 6–20)
CALCIUM: 7.4 mg/dL — AB (ref 8.9–10.3)
CHLORIDE: 106 mmol/L (ref 101–111)
CO2: 21 mmol/L — AB (ref 22–32)
Creatinine, Ser: 0.54 mg/dL (ref 0.44–1.00)
GFR calc non Af Amer: >60 mL/min (ref >60)
Glucose, Bld: 196 mg/dL — ABNORMAL HIGH (ref 65–99)
Potassium: 3.4 mmol/L — ABNORMAL LOW (ref 3.5–5.1)
Sodium: 136 mmol/L (ref 135–145)

## 2016-02-11 LAB — CBC WITH DIFFERENTIAL/PLATELET
BASOS ABS: 0 10*3/uL (ref 0.0–0.1)
Basophils Relative: 0 %
EOS ABS: 0.2 10*3/uL (ref 0.0–0.7)
Eosinophils Relative: 1 %
HEMATOCRIT: 28.8 % — AB (ref 36.0–46.0)
Hemoglobin: 9 g/dL — ABNORMAL LOW (ref 12.0–15.0)
LYMPHS ABS: 2.2 10*3/uL (ref 0.7–4.0)
Lymphocytes Relative: 12 %
MCH: 30.7 pg (ref 26.0–34.0)
MCHC: 31.3 g/dL (ref 30.0–36.0)
MCV: 98.3 fL (ref 78.0–100.0)
MONO ABS: 0.7 10*3/uL (ref 0.1–1.0)
MONOS PCT: 4 %
NEUTROS ABS: 15.6 10*3/uL — AB (ref 1.7–7.7)
Neutrophils Relative %: 83 %
PLATELETS: 231 10*3/uL (ref 150–400)
RBC: 2.93 MIL/uL — AB (ref 3.87–5.11)
RDW: 15.4 % (ref 11.5–15.5)
WBC: 18.7 10*3/uL — AB (ref 4.0–10.5)

## 2016-02-11 LAB — MAGNESIUM
MAGNESIUM: 2.1 mg/dL (ref 1.7–2.4)
Magnesium: 1.9 mg/dL (ref 1.7–2.4)

## 2016-02-11 LAB — COMPREHENSIVE METABOLIC PANEL
ALBUMIN: 1.4 g/dL — AB (ref 3.5–5.0)
ALT: 28 U/L (ref 14–54)
ANION GAP: 14 (ref 5–15)
AST: 118 U/L — AB (ref 15–41)
Alkaline Phosphatase: 96 U/L (ref 38–126)
BILIRUBIN TOTAL: 0.7 mg/dL (ref 0.3–1.2)
BUN: 7 mg/dL (ref 6–20)
CHLORIDE: 105 mmol/L (ref 101–111)
CO2: 18 mmol/L — ABNORMAL LOW (ref 22–32)
Calcium: 7.6 mg/dL — ABNORMAL LOW (ref 8.9–10.3)
Creatinine, Ser: 0.59 mg/dL (ref 0.44–1.00)
GFR calc Af Amer: >60 mL/min (ref >60)
GFR calc non Af Amer: >60 mL/min (ref >60)
GLUCOSE: 231 mg/dL — AB (ref 65–99)
POTASSIUM: 3.8 mmol/L (ref 3.5–5.1)
SODIUM: 137 mmol/L (ref 135–145)
TOTAL PROTEIN: 4.2 g/dL — AB (ref 6.5–8.1)

## 2016-02-11 LAB — PROCALCITONIN: PROCALCITONIN: 0.36 ng/mL

## 2016-02-11 LAB — GLUCOSE, CAPILLARY
GLUCOSE-CAPILLARY: 170 mg/dL — AB (ref 65–99)
GLUCOSE-CAPILLARY: 172 mg/dL — AB (ref 65–99)
GLUCOSE-CAPILLARY: 189 mg/dL — AB (ref 65–99)
GLUCOSE-CAPILLARY: 201 mg/dL — AB (ref 65–99)
GLUCOSE-CAPILLARY: 216 mg/dL — AB (ref 65–99)
GLUCOSE-CAPILLARY: 256 mg/dL — AB (ref 65–99)
Glucose-Capillary: 193 mg/dL — ABNORMAL HIGH (ref 65–99)

## 2016-02-11 LAB — FIBRINOGEN
Fibrinogen: 484 mg/dL — ABNORMAL HIGH (ref 204–475)
Fibrinogen: 484 mg/dL — ABNORMAL HIGH (ref 204–475)
Fibrinogen: 501 mg/dL — ABNORMAL HIGH (ref 204–475)

## 2016-02-11 LAB — CORTISOL: Cortisol, Plasma: 12.7 ug/dL

## 2016-02-11 LAB — POCT I-STAT 3, ART BLOOD GAS (G3+)
Acid-base deficit: 7 mmol/L — ABNORMAL HIGH (ref 0.0–2.0)
Bicarbonate: 18.5 mEq/L — ABNORMAL LOW (ref 20.0–24.0)
O2 SAT: 93 %
PCO2 ART: 34.2 mmHg — AB (ref 35.0–45.0)
PH ART: 7.341 — AB (ref 7.350–7.450)
PO2 ART: 71 mmHg — AB (ref 80.0–100.0)
Patient temperature: 98.5
TCO2: 20 mmol/L (ref 0–100)

## 2016-02-11 LAB — HEPARIN LEVEL (UNFRACTIONATED): Heparin Unfractionated: 0.62 IU/mL (ref 0.30–0.70)

## 2016-02-11 LAB — PHOSPHORUS
PHOSPHORUS: 2 mg/dL — AB (ref 2.5–4.6)
Phosphorus: 2.6 mg/dL (ref 2.5–4.6)

## 2016-02-11 MED ORDER — HYDROCORTISONE NA SUCCINATE PF 100 MG IJ SOLR
50.0000 mg | Freq: Four times a day (QID) | INTRAMUSCULAR | Status: DC
  Administered 2016-02-11 – 2016-02-15 (×16): 50 mg via INTRAVENOUS
  Filled 2016-02-11 (×2): qty 1
  Filled 2016-02-11: qty 2
  Filled 2016-02-11 (×3): qty 1
  Filled 2016-02-11: qty 2
  Filled 2016-02-11 (×11): qty 1

## 2016-02-11 MED ORDER — HEPARIN (PORCINE) IN NACL 100-0.45 UNIT/ML-% IJ SOLN
1000.0000 [IU]/h | INTRAMUSCULAR | Status: DC
  Administered 2016-02-11: 1000 [IU]/h via INTRAVENOUS
  Filled 2016-02-11: qty 250

## 2016-02-11 MED ORDER — PIPERACILLIN-TAZOBACTAM 3.375 G IVPB
3.3750 g | Freq: Three times a day (TID) | INTRAVENOUS | Status: DC
  Administered 2016-02-11 – 2016-02-13 (×8): 3.375 g via INTRAVENOUS
  Filled 2016-02-11 (×10): qty 50

## 2016-02-11 MED ORDER — DEXTROSE 5 % IV SOLN
2.0000 g | Freq: Once | INTRAVENOUS | Status: AC
  Administered 2016-02-11: 2 g via INTRAVENOUS
  Filled 2016-02-11: qty 4

## 2016-02-11 MED ORDER — VANCOMYCIN HCL IN DEXTROSE 1-5 GM/200ML-% IV SOLN
1000.0000 mg | Freq: Two times a day (BID) | INTRAVENOUS | Status: DC
  Administered 2016-02-11 – 2016-02-12 (×4): 1000 mg via INTRAVENOUS
  Filled 2016-02-11 (×5): qty 200

## 2016-02-11 MED ORDER — FUROSEMIDE 10 MG/ML IJ SOLN
5.0000 mg/h | INTRAVENOUS | Status: DC
  Administered 2016-02-11: 8 mg/h via INTRAVENOUS
  Filled 2016-02-11 (×2): qty 25

## 2016-02-11 MED ORDER — DEXTROSE 5 % IV SOLN
10.0000 mmol | Freq: Once | INTRAVENOUS | Status: AC
  Administered 2016-02-11: 10 mmol via INTRAVENOUS
  Filled 2016-02-11: qty 3.33

## 2016-02-11 NOTE — Progress Notes (Signed)
Pharmacy Antibiotic Note  Abigail Baker is a 50 y.o. female admitted on 28-Jan-2016 with Pancreatitis.  Pharmacy has been consulted for Vancomycin/Zosyn dosing for possible sepsis. Pt is febrile and WBC has risen from 13.3>>17.2. Pt is also hypotensive requiring phenylephrine.  Plan: -Vancomycin 1000 mg IV q12h -Zosyn 3.375G IV q8h to be infused over 4 hours -Trend WBC, temp, renal function  -Drug levels as indicated    Height: 5\' 1"  (154.9 cm) Weight: 154 lb 8.7 oz (70.1 kg) IBW/kg (Calculated) : 47.8  Temp (24hrs), Avg:99.7 F (37.6 C), Min:98.4 F (36.9 C), Max:101.1 F (38.4 C)   Recent Labs Lab 18-May-2016 2305 02/09/16 1730 02/10/16 0038  WBC 13.3*  --  17.2*  CREATININE 0.71 0.61 0.55  LATICACIDVEN 1.6  --   --     Estimated Creatinine Clearance: 77.8 mL/min (by C-G formula based on Cr of 0.55).    No Known Allergies   Abigail Baker, Abigail Baker 02/11/2016 12:07 AM

## 2016-02-11 NOTE — Progress Notes (Signed)
PULMONARY / CRITICAL CARE MEDICINE   Name: Abigail Baker MRN: [id number] DOB: 03-07-1966    ADMISSION DATE:  02/03/2016  CHIEF COMPLAINT:  Pancreatitis  HISTORY OF PRESENT ILLNESS:   50 yo F with h/o DM, Etoh abuse, who presented to [facility] on 01/29/2016 after several weeks of abdominal pain, n/v.  She was found to have most likely EtOH pancreatitis, she developed ARDS and reportedly b/l PNA for which she was treated with abx.  She also developed a pancreatic pseudocyst.  She was transferred to [facility] hospital for 24/7 CCM and advanced ventilator management given bouts of hypoxemia despite high PEEP/FiO2  SUBJECTIVE: ekos done  VITAL SIGNS: Temp:  [97.7 F (36.5 C)-101.1 F (38.4 C)] 97.7 F (36.5 C) (03/29 0802) Pulse Rate:  [74-109] 88 (03/29 1145) Resp:  [0-29] 18 (03/29 1145) BP: (79-123)/(55-87) 87/62 mmHg (03/29 1122) SpO2:  [87 %-98 %] 95 % (03/29 1145) Arterial Line BP: (95-149)/(42-77) 117/57 mmHg (03/29 1145) FiO2 (%):  [80 %] 80 % (03/29 1122) Weight:  [75.3 kg (166 lb 0.1 oz)] 75.3 kg (166 lb 0.1 oz) (03/29 0500) HEMODYNAMICS:   VENTILATOR SETTINGS: Vent Mode:  [-] PCV FiO2 (%):  [80 %] 80 % Set Rate:  [16 bmp] 16 bmp PEEP:  [14 cmH20] 14 cmH20 Plateau Pressure:  [31 cmH20-36 cmH20] 31 cmH20 INTAKE / OUTPUT:  Intake/Output Summary (Last 24 hours) at 02/11/16 1220 Last data filed at 02/11/16 1100  Gross per 24 hour  Intake 5777.87 ml  Output   1190 ml  Net 4587.87 ml    PHYSICAL EXAMINATION: General:  Sedated. No distress. No family at bedside.  Neuro:  Intubated and sedated. Pupils equal. HEENT:  Endotracheal tube in place Cardiovascular:  Regular rate. Anasarca. Normal S1 & S2. Lungs:  Course bilateral equal Abdomen:  Soft. Nondistended. Hypoactive bowel sounds persist. Integument:  Warm & Dry. No rash on exposed skin.  LABS:  CBC  Recent Labs Lab 02/10/2016 2305 02/10/16 0038 02/11/16 0335  WBC 13.3* 17.2* 18.7*  HGB 9.4* 9.4* 9.0*  HCT  28.9* 29.8* 28.8*  PLT 123* 166 231   Coag's  Recent Labs Lab 01/21/2016 2305  APTT 38*  INR 1.34   BMET  Recent Labs Lab 02/09/16 1730 02/10/16 0038 02/11/16 0335  NA 138 138 137  K 2.9* 4.0 3.8  CL 105 105 105  CO2 18* 23 18*  BUN CREATININE 0.61 0.55 0.59  GLUCOSE 167* 130* 231*   Electrolytes  Recent Labs Lab 01/20/2016 2305 02/09/16 1730 02/10/16 0038 02/11/16 0335  CALCIUM 8.1* 8.1* 8.1* 7.6*  MG 1.5* 2.1 2.1 2.1  PHOS 3.9  --  3.3 2.6   Sepsis Markers  Recent Labs Lab 01/22/2016 2305 02/11/16 0335  LATICACIDVEN 1.6  --   PROCALCITON 0.14 0.36   ABG  Recent Labs Lab 02/09/16 1847 02/10/16 0818 02/11/16 0131  PHART 7.300* 7.281* 7.341*  PCO2ART 44.5 37.1 34.2*  PO2ART 58.0* 87.0 71.0*   Liver Enzymes  Recent Labs Lab 01/31/2016 2305 02/10/16 0038 02/11/16 0335  AST 76* 67* 118*  ALT ALKPHOS 81 87 96  BILITOT 1.1 0.5 0.7  ALBUMIN 1.5* 1.6* 1.4*   Cardiac Enzymes  Recent Labs Lab 02/09/16 1940 02/10/16 0038 02/10/16 0634  TROPONINI 0.10* 0.09* 0.06*   Glucose  Recent Labs Lab 02/10/16 2012 02/10/16 2339 02/10/16 2341 02/11/16 0336 02/11/16 0759 02/11/16 1139  GLUCAP 150* 193* 201* 172* 189* 216*    Imaging Ir Angiogram Pulmonary  Right Selective  02/11/2016  INDICATION: 50 year old female with alcoholic pancreatitis and sudden onset respiratory failure at an outside hospital. CT imaging confirms large volume right-sided pulmonary embolus with evidence right heart strain. Additionally, she has significant airspace disease bilaterally and bilateral layering pleural effusions. She presents to interventional radiology for potential right-sided thoracentesis if there is sufficient fluid as well as right-sided PE lysis. EXAM: IR INFUSION THROMBOL VENOUS INITIAL (MS); IR ULTRASOUND GUIDANCE VASC ACCESS RIGHT; RIGHT PULMONARY ARTERIOGRAPHY; ADDITIONAL ARTERIOGRAPHY COMPARISON:  CT chest 02/09/2016 MEDICATIONS: None.  ANESTHESIA/SEDATION: Versed 1 mg IV; Fentanyl 50 mcg IV Moderate Sedation Time:  30 The patient was continuously monitored during the procedure by the interventional radiology nurse under my direct supervision. FLUOROSCOPY TIME:  Fluoroscopy Time: 8 minutes 24 seconds (28 mGy). COMPLICATIONS: None immediate. TECHNIQUE: Informed written consent was obtained from the patient after a thorough discussion of the procedural risks, benefits and alternatives. All questions were addressed. Maximal Sterile Barrier Technique was utilized including caps, mask, sterile gowns, sterile gloves, sterile drape, hand hygiene and skin antiseptic. A timeout was performed prior to the initiation of the procedure. The right chest was interrogated with ultrasound. There is only trace pleural fluid. No safe window from a lateral approach for thoracentesis. Attention was turned to the right groin. The right groin was interrogated with ultrasound. The common femoral vein is patent. There is no evidence of thrombus. Image was obtained and stored for the medical record. Using real-time sonographic guidance, the vessels punctured with a 21 gauge micropuncture needle. Using standard technique, the initial micro wire was exchanged through a transitional 5 [place] micro sheath for a Bentson wire. A 6 French vascular sheath was then advanced over the Bentson wire and into the common femoral vein. An angled catheter was advanced over the Bentson wire and into the right heart. The Bentson wire was exchanged for a Glidewire which was then navigated into the main pulmonary artery. The main pulmonary arteriogram was performed. This confirms a large filling defect within the right main pulmonary artery consistent with acute pulmonary embolus. There is significantly decreased perfusion to the right lung. The catheter was next navigated into the right main pulmonary artery. Pressures were obtained. The right pulmonary arterial pressure is 42/20 (mean 30). A  right pulmonary arteriogram was performed. Again, there is a large filling defect in the right main pulmonary artery extending into the upper lobe branches. There is significantly decreased perfusion in the right lung. The angled catheter was successfully navigated through the thrombus in into a branch of the right lower lobe pulmonary artery. The glidewire was exchanged for a Rosen wire. A 12 cm infusion length EKOS catheter was advanced over the wire and positioned across the thrombus. Pulmonary arterial thrombolysis was then initiated at a rate of 1 milligram/hour. The catheters were secured in place and a sterile bandage applied. The patient tolerated the procedure well and was returned to the intensive care unit. FINDINGS: Insufficient right-sided pleural fluid for thoracentesis. Right main pulmonary arterial pressure 42/20 (30) mm Hg Large volume thrombus within the right main pulmonary artery extending into right upper and lower lobar branches with significantly decreased perfusion to the right lung. IMPRESSION: 1. Insufficient right-sided pleural fluid for thoracentesis. Thoracentesis was not performed. 2. Pulmonary arterial hypertension with a right main pulmonary arterial pressure of 42/20 (30) mm Hg. 3. Initiation of right-sided pulmonary arterial thrombolysis at a rate of 1 milligram/hour which will continue for a total dose of 24 mg. Signed, Sterling Big, MD [facility] Radiology Electronically Signed   By: Malachy Moan M.D.   On: 02/11/2016 10:09   Ir Angiogram Selective Each Additional Vessel  02/11/2016  INDICATION: 50 year old female with alcoholic pancreatitis and sudden onset respiratory failure at an outside hospital. CT imaging confirms large volume right-sided pulmonary embolus with evidence right heart strain. Additionally, she has significant airspace disease bilaterally and bilateral layering pleural effusions. She presents to  interventional radiology for potential right-sided thoracentesis if there is sufficient fluid as well as right-sided PE lysis. EXAM: IR INFUSION THROMBOL VENOUS INITIAL (MS); IR ULTRASOUND GUIDANCE VASC ACCESS RIGHT; RIGHT PULMONARY ARTERIOGRAPHY; ADDITIONAL ARTERIOGRAPHY COMPARISON:  CT chest 02/09/2016 MEDICATIONS: None. ANESTHESIA/SEDATION: Versed 1 mg IV; Fentanyl 50 mcg IV Moderate Sedation Time:  30 The patient was continuously monitored during the procedure by the interventional radiology nurse under my direct supervision. FLUOROSCOPY TIME:  Fluoroscopy Time: 8 minutes 24 seconds (28 mGy). COMPLICATIONS: None immediate. TECHNIQUE: Informed written consent was obtained from the patient after a thorough discussion of the procedural risks, benefits and alternatives. All questions were addressed. Maximal Sterile Barrier Technique was utilized including caps, mask, sterile gowns, sterile gloves, sterile drape, hand hygiene and skin antiseptic. A timeout was performed prior to the initiation of the procedure. The right chest was interrogated with ultrasound. There is only trace pleural fluid. No safe window from a lateral approach for thoracentesis. Attention was turned to the right groin. The right groin was interrogated with ultrasound. The common femoral vein is patent. There is no evidence of thrombus. Image was obtained and stored for the medical record. Using real-time sonographic guidance, the vessels punctured with a 21 gauge micropuncture needle. Using standard technique, the initial micro wire was exchanged through a transitional 5 [place] micro sheath for a Bentson wire. A 6 French vascular sheath was then advanced over the Bentson wire and into the common femoral vein. An angled catheter was advanced over the Bentson wire and into the right heart. The Bentson wire was exchanged for a Glidewire which was then navigated into the main pulmonary artery. The main pulmonary arteriogram was performed. This  confirms a large filling defect within the right main pulmonary artery consistent with acute pulmonary embolus. There is significantly decreased perfusion to the right lung. The catheter was next navigated into the right main pulmonary artery. Pressures were obtained. The right pulmonary arterial pressure is 42/20 (mean 30). A right pulmonary arteriogram was performed. Again, there is a large filling defect in the right main pulmonary artery extending into the upper lobe branches. There is significantly decreased perfusion in the right lung. The angled catheter was successfully navigated through the thrombus in into a branch of the right lower lobe pulmonary artery. The glidewire was exchanged for a Rosen wire. A 12 cm infusion length EKOS catheter was advanced over the wire and positioned across the thrombus. Pulmonary arterial thrombolysis was then initiated at a rate of 1 milligram/hour. The catheters were secured in place and a sterile bandage applied. The patient tolerated the procedure well and was returned to the intensive care unit. FINDINGS: Insufficient right-sided pleural fluid for thoracentesis. Right main pulmonary arterial pressure 42/20 (30) mm Hg Large volume thrombus within the right main pulmonary artery extending into right upper and lower lobar branches with significantly decreased perfusion to the right lung. IMPRESSION: 1. Insufficient right-sided pleural fluid for thoracentesis. Thoracentesis was not performed. 2. Pulmonary arterial hypertension with a right main pulmonary arterial pressure of 42/20 (30) mm Hg. 3. Initiation of  right-sided pulmonary arterial thrombolysis at a rate of 1 milligram/hour which will continue for a total dose of 24 mg. Signed, Sterling BigHeath K. McCullough, MD [facility] Electronically Signed   By: Malachy MoanHeath  McCullough M.D.   On: 02/11/2016 10:09   Ir Kore[place] Guide Vasc Access Right  02/11/2016  INDICATION: 50 year old  female with alcoholic pancreatitis and sudden onset respiratory failure at an outside hospital. CT imaging confirms large volume right-sided pulmonary embolus with evidence right heart strain. Additionally, she has significant airspace disease bilaterally and bilateral layering pleural effusions. She presents to interventional radiology for potential right-sided thoracentesis if there is sufficient fluid as well as right-sided PE lysis. EXAM: IR INFUSION THROMBOL VENOUS INITIAL (MS); IR ULTRASOUND GUIDANCE VASC ACCESS RIGHT; RIGHT PULMONARY ARTERIOGRAPHY; ADDITIONAL ARTERIOGRAPHY COMPARISON:  CT chest 02/09/2016 MEDICATIONS: None. ANESTHESIA/SEDATION: Versed 1 mg IV; Fentanyl 50 mcg IV Moderate Sedation Time:  30 The patient was continuously monitored during the procedure by the interventional radiology nurse under my direct supervision. FLUOROSCOPY TIME:  Fluoroscopy Time: 8 minutes 24 seconds (28 mGy). COMPLICATIONS: None immediate. TECHNIQUE: Informed written consent was obtained from the patient after a thorough discussion of the procedural risks, benefits and alternatives. All questions were addressed. Maximal Sterile Barrier Technique was utilized including caps, mask, sterile gowns, sterile gloves, sterile drape, hand hygiene and skin antiseptic. A timeout was performed prior to the initiation of the procedure. The right chest was interrogated with ultrasound. There is only trace pleural fluid. No safe window from a lateral approach for thoracentesis. Attention was turned to the right groin. The right groin was interrogated with ultrasound. The common femoral vein is patent. There is no evidence of thrombus. Image was obtained and stored for the medical record. Using real-time sonographic guidance, the vessels punctured with a 21 gauge micropuncture needle. Using standard technique, the initial micro wire was exchanged through a transitional 5 [place] micro sheath for a Bentson wire. A 6 French vascular sheath  was then advanced over the Bentson wire and into the common femoral vein. An angled catheter was advanced over the Bentson wire and into the right heart. The Bentson wire was exchanged for a Glidewire which was then navigated into the main pulmonary artery. The main pulmonary arteriogram was performed. This confirms a large filling defect within the right main pulmonary artery consistent with acute pulmonary embolus. There is significantly decreased perfusion to the right lung. The catheter was next navigated into the right main pulmonary artery. Pressures were obtained. The right pulmonary arterial pressure is 42/20 (mean 30). A right pulmonary arteriogram was performed. Again, there is a large filling defect in the right main pulmonary artery extending into the upper lobe branches. There is significantly decreased perfusion in the right lung. The angled catheter was successfully navigated through the thrombus in into a branch of the right lower lobe pulmonary artery. The glidewire was exchanged for a Rosen wire. A 12 cm infusion length EKOS catheter was advanced over the wire and positioned across the thrombus. Pulmonary arterial thrombolysis was then initiated at a rate of 1 milligram/hour. The catheters were secured in place and a sterile bandage applied. The patient tolerated the procedure well and was returned to the intensive care unit. FINDINGS: Insufficient right-sided pleural fluid for thoracentesis. Right main pulmonary arterial pressure 42/20 (30) mm Hg Large volume thrombus within the right main pulmonary artery extending into right upper and lower lobar branches with significantly decreased perfusion to the right lung. IMPRESSION: 1. Insufficient right-sided pleural  fluid for thoracentesis. Thoracentesis was not performed. 2. Pulmonary arterial hypertension with a right main pulmonary arterial pressure of 42/20 (30) mm Hg. 3. Initiation of right-sided pulmonary arterial thrombolysis at a rate of 1  milligram/hour which will continue for a total dose of 24 mg. Signed, Sterling Big, MD [facility] Radiology Electronically Signed   By: Malachy Moan M.D.   On: 02/11/2016 10:09   Dg Chest Port 1 View  02/11/2016  CLINICAL DATA:  Followup endotracheal tube positioning. Subsequent encounter. EXAM: PORTABLE CHEST 1 VIEW COMPARISON:  Chest radiograph and CTA of the chest performed 02/09/2016 FINDINGS: The patient's endotracheal tube is seen ending 2 cm above the carina. Enteric tubes are noted extending below the diaphragm. A right IJ line is noted ending about the proximal right atrium. Small bilateral pleural effusions are noted, more prominent than on the prior study, with bilateral airspace opacification, likely reflecting pulmonary edema. No pneumothorax is seen. The cardiomediastinal silhouette is borderline normal in size. No acute osseous abnormalities are identified. IMPRESSION: 1. Endotracheal tube noted ending 2 cm above the carina. 2. Small bilateral pleural effusions, more prominent than on the prior study, with bilateral airspace opacification, likely reflecting worsening pulmonary edema. Pneumonia could have a similar appearance. Electronically Signed   By: Roanna Raider M.D.   On: 02/11/2016 06:59   Dg Abd Portable 1v  02/10/2016  CLINICAL DATA:  Feeding tube placement. EXAM: PORTABLE ABDOMEN - 1 VIEW COMPARISON:  02/09/2016 FINDINGS: There is normal small bowel gas pattern. NG tube in place with tip in mid stomach. There is a NG feeding tube with tip in distal stomach or proximal duodenum. IMPRESSION: NG tube in place with tip in mid stomach. There is a NG feeding tube with tip in distal stomach or proximal duodenum. Electronically Signed   By: Natasha Mead M.D.   On: 02/10/2016 13:09   Ir Infusion Thrombol Venous Initial (ms)  02/11/2016  INDICATION: 50 year old female with alcoholic pancreatitis and sudden onset respiratory failure  at an outside hospital. CT imaging confirms large volume right-sided pulmonary embolus with evidence right heart strain. Additionally, she has significant airspace disease bilaterally and bilateral layering pleural effusions. She presents to interventional radiology for potential right-sided thoracentesis if there is sufficient fluid as well as right-sided PE lysis. EXAM: IR INFUSION THROMBOL VENOUS INITIAL (MS); IR ULTRASOUND GUIDANCE VASC ACCESS RIGHT; RIGHT PULMONARY ARTERIOGRAPHY; ADDITIONAL ARTERIOGRAPHY COMPARISON:  CT chest 02/09/2016 MEDICATIONS: None. ANESTHESIA/SEDATION: Versed 1 mg IV; Fentanyl 50 mcg IV Moderate Sedation Time:  30 The patient was continuously monitored during the procedure by the interventional radiology nurse under my direct supervision. FLUOROSCOPY TIME:  Fluoroscopy Time: 8 minutes 24 seconds (28 mGy). COMPLICATIONS: None immediate. TECHNIQUE: Informed written consent was obtained from the patient after a thorough discussion of the procedural risks, benefits and alternatives. All questions were addressed. Maximal Sterile Barrier Technique was utilized including caps, mask, sterile gowns, sterile gloves, sterile drape, hand hygiene and skin antiseptic. A timeout was performed prior to the initiation of the procedure. The right chest was interrogated with ultrasound. There is only trace pleural fluid. No safe window from a lateral approach for thoracentesis. Attention was turned to the right groin. The right groin was interrogated with ultrasound. The common femoral vein is patent. There is no evidence of thrombus. Image was obtained and stored for the medical record. Using real-time sonographic guidance, the vessels punctured with a 21 gauge micropuncture needle. Using standard technique, the initial micro wire was  exchanged through a transitional 5 [place] micro sheath for a Bentson wire. A 6 French vascular sheath was then advanced over the Bentson wire and into the common femoral  vein. An angled catheter was advanced over the Bentson wire and into the right heart. The Bentson wire was exchanged for a Glidewire which was then navigated into the main pulmonary artery. The main pulmonary arteriogram was performed. This confirms a large filling defect within the right main pulmonary artery consistent with acute pulmonary embolus. There is significantly decreased perfusion to the right lung. The catheter was next navigated into the right main pulmonary artery. Pressures were obtained. The right pulmonary arterial pressure is 42/20 (mean 30). A right pulmonary arteriogram was performed. Again, there is a large filling defect in the right main pulmonary artery extending into the upper lobe branches. There is significantly decreased perfusion in the right lung. The angled catheter was successfully navigated through the thrombus in into a branch of the right lower lobe pulmonary artery. The glidewire was exchanged for a Rosen wire. A 12 cm infusion length EKOS catheter was advanced over the wire and positioned across the thrombus. Pulmonary arterial thrombolysis was then initiated at a rate of 1 milligram/hour. The catheters were secured in place and a sterile bandage applied. The patient tolerated the procedure well and was returned to the intensive care unit. FINDINGS: Insufficient right-sided pleural fluid for thoracentesis. Right main pulmonary arterial pressure 42/20 (30) mm Hg Large volume thrombus within the right main pulmonary artery extending into right upper and lower lobar branches with significantly decreased perfusion to the right lung. IMPRESSION: 1. Insufficient right-sided pleural fluid for thoracentesis. Thoracentesis was not performed. 2. Pulmonary arterial hypertension with a right main pulmonary arterial pressure of 42/20 (30) mm Hg. 3. Initiation of right-sided pulmonary arterial thrombolysis at a rate of 1 milligram/hour which will continue for a total dose of 24 mg. Signed,  Sterling Big, MD [facility] Special[facility] Radiology Electronically Signed   By: Malachy Moan M.D.   On: 02/11/2016 10:09   EVENTS: 3/16 - Admit to OSH [facility]) 3/19 - Worsening hypoxia & altered mentation. Transferred to ICU. Failed trial of BiPAP & was intubated. 3/26 - Transfer to [facility] 3/28 PE noted, EKOS perfromed  STUDIES: CT ABD/PELVIS OSH 3/23:  Subacute pancreatitis w/ 6.9x3.6cm pseudocyst in lesser sac & 7mm pseudocyst in tail. Moderate bilateral pleural effusions.  Port CXR 3/26:  Personally reviewed by me. R IJ at cavoatrial junction. ETT 2cm above carina. Bilateral opacities. CTA Chest 3/27:  Moderate bilateral pleural effusions. Diffuse lung opacities. Small PE left lower lobe branch & large central PE right pulmonary artery w/ RV/LV ratio 1.1. KUB 3/27:  Feeding tube in stomach.  MICROBIOLOGY: MRSA PCR 3/26:  Negative   ANTIBIOTICS: None.  LINES/TUBES: OETT [place] 3/19>>> R IJ CVL 3/19 or 3/20>>> OGT>>> Foley>>> R Radial Art Line 3/27>>>  ASSESSMENT / PLAN:  PULMONARY A: Bilateral PE - Large R Central PE. ARDS Bilateral Pleural Effusions Tobacco Use  P:   Dc nicotine patch in icu abg reviewed, goal is to 60% peep 10 for trach Will consent trach May need drain rt effusion Neg balance needed, was pos 4 liters last 24 hours Keep same MV  CARDIOVASCULAR A:  Shock - Hypovolemia vs Sepsis vs PE Anasarca  P:  Neo-Synephrine to maintain MAP>>60 Get cortsiol  RENAL A:   NonAGMA AGMA - Resolved. Hypomagnesemia - Resolved. 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 Severe Protein-Calorie Malnutrition  P:   Protonix IV q24hr Place post-pyloric Core Track Feeding tube, then feed after ekos removal  HEMATOLOGIC A:   PE Anemia - No signs of active bleeding. S/P previous transfusion for Hgb of 8. Leukocytosis -  Increasing.  Thrombocytopenia - Resolved.   P:  Heparin drip per pharmacy protocol NO long acting agents as will need trach Get pt, inr  INFECTIOUS A:   Fever 3/28 On multiple antibiotics previously.  P:   Pct neg Vosyn started, follow cultures  ENDOCRINE A:   H/O DM Type I R/o rel AI P:   Accu-Checks q4hr  SSI per algorithm  NEUROLOGIC A:   Sedation on Ventilator H/O EtOH Use  P:   RASS goal: 0 to -1 Fentanly gtt & IV prn Versed gtt & IV prn Seroquel 50mg  qhs- dc Thiamine & FA IV daily WUA   Ccm time 40 min   Mcarthur Rossetti. Tyson Alias, MD, FACP Pgr: [phone] [place] [facility]

## 2016-02-11 NOTE — Care Management Note (Addendum)
Case Management Note  Patient Details  Name: Abigail Baker MRN: [id number] Date of Birth: 10/10/1966  Subjective/Objective:   Pt admitted with pancretitis:  She was found to have most likely EtOH pancreatitis, she developed ARDS and reportedly b/l PNA for which she was treated with abx. She also developed a pancreatic pseudocyst. She was transferred to [facility] and intubated,  Action/Plan:  Pt is independent from home with mom.  Mom and sister at bedside.  CM will continue to monitor for disposition needs   Expected Discharge Date:                  Expected Discharge Plan:  Home/Self Care  In-House Referral:     Discharge planning Services  CM Consult  Post Acute Care Choice:    Choice offered to:     DME Arranged:    DME Agency:     HH Arranged:    HH Agency:     Status of Service:  In process, will continue to follow  Medicare Important Message Given:    Date Medicare IM Given:    Medicare IM give by:    Date Additional Medicare IM Given:    Additional Medicare Important Message give by:     If discussed at Long Length of Stay Meetings, dates discussed:    Additional Comments:  Cherylann ParrClaxton, Asha Grumbine S, RN 02/11/2016, 2:25 PM

## 2016-02-11 NOTE — Progress Notes (Signed)
ANTICOAGULATION CONSULT NOTE - Follow Up Consult  Pharmacy Consult for Heparin Indication: pulmonary embolus  No Known Allergies  Patient Measurements: Height: 5\' 1"  (154.9 cm) Weight: 166 lb 0.1 oz (75.3 kg) IBW/kg (Calculated) : 47.8 Heparin Dosing Weight: 62 kg  Vital Signs: Temp: 97.7 F (36.5 C) (03/29 0802) Temp Source: Oral (03/29 1142) BP: 115/86 mmHg (03/29 1500) Pulse Rate: 84 (03/29 1500)  Labs:  Recent Labs  08/05/16 2305  02/09/16 1940 02/10/16 0038 02/10/16 [id number] 02/10/16 0634 02/11/16 0335 02/11/16 1357  HGB 9.4*  --   --  9.4*  --   --  9.0*  --   HCT 28.9*  --   --  29.8*  --   --  28.8*  --   PLT 123*  --   --  166  --   --  231  --   APTT 38*  --   --   --   --   --   --   --   LABPROT 16.7*  --   --   --   --   --   --   --   INR 1.34  --   --   --   --   --   --   --   HEPARINUNFRC  --   --   --  0.66 0.68  --  0.62  --   CREATININE 0.71  < >  --  0.55  --   --  0.59 0.54  TROPONINI  --   --  0.10* 0.09*  --  0.06*  --   --   < > = values in this interval not displayed.  Estimated Creatinine Clearance: 80.7 mL/min (by C-G formula based on Cr of 0.54).   Assessment: [age] YOF who [facility]16 with likely EtOH pancreatitis and ARDS for ventilator management. She was started on IV heparin for acute PE and is now s/p EKOS. MD to verify when to restart heparin. Heparin had been therpeutic on a rate of 1000 units/hr this morning.   Goal of Therapy:  Heparin level 0.3-0.7 units/ml Monitor platelets by anticoagulation protocol: Yes   Plan:  - Resumed heparin gtt at 1000 units/hr at the time specified by radiology - Check an 8 hour heparin level - Daily heparin level and CBC - F[facility]  Lysle Pearlachel Bria Portales, PharmD, BCPS Pager # [phone] 02/11/2016 3:26 PM

## 2016-02-11 NOTE — Progress Notes (Signed)
Inpatient Diabetes Program Recommendations  AACE/ADA: New Consensus Statement on Inpatient Glycemic Control (2015)  Target Ranges:  Prepandial:   less than 140 mg/dL      Peak postprandial:   less than 180 mg/dL (1-2 hours)      Critically ill patients:  140 - 180 mg/dL   Review of Glycemic Control  Inpatient Diabetes Program Recommendations:  Insulin - Basal: consider adding Levemir 10 units  Order A1C to assess glycemic control. Thank you  Piedad ClimesGina Sharlyne Koeneman BSN, RN,CDE Inpatient Diabetes Coordinator [phone] (team pager)

## 2016-02-11 NOTE — Progress Notes (Signed)
Titrated FIO2 to 80% and Peep to 10, SAt 94%.

## 2016-02-11 NOTE — Progress Notes (Signed)
Pharmacy - Heparin Protocol  Clarified with Dr.Shick and ok to restart heparin now.  Sheppard CoilFrank Wilson PharmD., BCPS Clinical Pharmacist Pager [phone] 02/11/2016 5:31 PM

## 2016-02-11 NOTE — Progress Notes (Signed)
eLink Physician-Brief Progress Note Patient Name: Abigail Baker DOB: 06/27/1966 MRN: [id number]   Date of Service  02/11/2016  HPI/Events of Note  ABG on 80%/PC 20/Rate 16/P 14 = 7.34/34/71/18.5  eICU Interventions  Continue current ventilator management.      Intervention Category Major Interventions: Acid-Base disturbance - evaluation and management;Respiratory failure - evaluation and management  Dalton Mille Eugene 02/11/2016, 1:42 AM

## 2016-02-11 NOTE — Procedures (Signed)
Success Rt PE LYSIS Post lysis Rt PA pressure 30/16(22) No comp Access removed Full report in PACS

## 2016-02-12 ENCOUNTER — Inpatient Hospital Stay (HOSPITAL_COMMUNITY): Payer: BLUE CROSS/BLUE SHIELD

## 2016-02-12 LAB — COMPREHENSIVE METABOLIC PANEL
ALK PHOS: 105 U/L (ref 38–126)
ALT: 38 U/L (ref 14–54)
AST: 129 U/L — ABNORMAL HIGH (ref 15–41)
Albumin: 1.6 g/dL — ABNORMAL LOW (ref 3.5–5.0)
Anion gap: 12 (ref 5–15)
BUN: 7 mg/dL (ref 6–20)
CALCIUM: 7.4 mg/dL — AB (ref 8.9–10.3)
CHLORIDE: 94 mmol/L — AB (ref 101–111)
CO2: 32 mmol/L (ref 22–32)
CREATININE: 0.64 mg/dL (ref 0.44–1.00)
Glucose, Bld: 330 mg/dL — ABNORMAL HIGH (ref 65–99)
Potassium: 2.4 mmol/L — CL (ref 3.5–5.1)
Sodium: 138 mmol/L (ref 135–145)
Total Bilirubin: 0.8 mg/dL (ref 0.3–1.2)
Total Protein: 5.2 g/dL — ABNORMAL LOW (ref 6.5–8.1)

## 2016-02-12 LAB — BASIC METABOLIC PANEL
Anion gap: 12 (ref 5–15)
Anion gap: 13 (ref 5–15)
Anion gap: 9 (ref 5–15)
BUN: 8 mg/dL (ref 6–20)
BUN: 7 mg/dL (ref 6–20)
BUN: 7 mg/dL (ref 6–20)
CHLORIDE: 87 mmol/L — AB (ref 101–111)
CHLORIDE: 91 mmol/L — AB (ref 101–111)
CO2: 40 mmol/L — ABNORMAL HIGH (ref 22–32)
CO2: 39 mmol/L — ABNORMAL HIGH (ref 22–32)
CO2: 39 mmol/L — AB (ref 22–32)
CREATININE: 0.57 mg/dL (ref 0.44–1.00)
CREATININE: 0.66 mg/dL (ref 0.44–1.00)
CREATININE: 0.53 mg/dL (ref 0.44–1.00)
Calcium: 7.2 mg/dL — ABNORMAL LOW (ref 8.9–10.3)
Calcium: 7.1 mg/dL — ABNORMAL LOW (ref 8.9–10.3)
Calcium: 7.2 mg/dL — ABNORMAL LOW (ref 8.9–10.3)
Chloride: 88 mmol/L — ABNORMAL LOW (ref 101–111)
GFR calc Af Amer: >60 mL/min (ref >60)
GFR calc Af Amer: >60 mL/min (ref >60)
GFR calc Af Amer: >60 mL/min (ref >60)
GFR calc non Af Amer: >60 mL/min (ref >60)
GFR calc non Af Amer: >60 mL/min (ref >60)
GLUCOSE: 399 mg/dL — AB (ref 65–99)
GLUCOSE: 390 mg/dL — AB (ref 65–99)
GLUCOSE: 386 mg/dL — AB (ref 65–99)
POTASSIUM: 2.9 mmol/L — AB (ref 3.5–5.1)
POTASSIUM: 3.7 mmol/L (ref 3.5–5.1)
Potassium: 2 mmol/L — CL (ref 3.5–5.1)
SODIUM: 140 mmol/L (ref 135–145)
SODIUM: 139 mmol/L (ref 135–145)
SODIUM: 139 mmol/L (ref 135–145)

## 2016-02-12 LAB — POCT I-STAT 3, ART BLOOD GAS (G3+)
ACID-BASE EXCESS: 19 mmol/L — AB (ref 0.0–2.0)
ACID-BASE EXCESS: 18 mmol/L — AB (ref 0.0–2.0)
Bicarbonate: 43.6 mEq/L — ABNORMAL HIGH (ref 20.0–24.0)
Bicarbonate: 44.7 mEq/L — ABNORMAL HIGH (ref 20.0–24.0)
O2 SAT: 98 %
O2 Saturation: 92 %
PCO2 ART: 49.6 mmHg — AB (ref 35.0–45.0)
PH ART: 7.552 — AB (ref 7.350–7.450)
PH ART: 7.448 (ref 7.350–7.450)
PO2 ART: 56 mmHg — AB (ref 80.0–100.0)
PO2 ART: 97 mmHg (ref 80.0–100.0)
Patient temperature: 98.2
Patient temperature: 98.2
TCO2: 45 mmol/L (ref 0–100)
TCO2: 47 mmol/L (ref 0–100)
pCO2 arterial: 64.4 mmHg (ref 35.0–45.0)

## 2016-02-12 LAB — CBC WITH DIFFERENTIAL/PLATELET
Basophils Absolute: 0 10*3/uL (ref 0.0–0.1)
Basophils Relative: 0 %
EOS PCT: 0 %
Eosinophils Absolute: 0 10*3/uL (ref 0.0–0.7)
HCT: 32.6 % — ABNORMAL LOW (ref 36.0–46.0)
Hemoglobin: 10.3 g/dL — ABNORMAL LOW (ref 12.0–15.0)
LYMPHS ABS: 0.9 10*3/uL (ref 0.7–4.0)
LYMPHS PCT: 5 %
MCH: 30.7 pg (ref 26.0–34.0)
MCHC: 31.6 g/dL (ref 30.0–36.0)
MCV: 97.3 fL (ref 78.0–100.0)
MONO ABS: 0.5 10*3/uL (ref 0.1–1.0)
MONOS PCT: 3 %
Neutro Abs: 17 10*3/uL — ABNORMAL HIGH (ref 1.7–7.7)
Neutrophils Relative %: 92 %
PLATELETS: 233 10*3/uL (ref 150–400)
RBC: 3.35 MIL/uL — AB (ref 3.87–5.11)
RDW: 15.9 % — ABNORMAL HIGH (ref 11.5–15.5)
WBC: 18.4 10*3/uL — ABNORMAL HIGH (ref 4.0–10.5)

## 2016-02-12 LAB — PROTIME-INR
INR: 2.49 — AB (ref 0.00–1.49)
PROTHROMBIN TIME: 26.6 s — AB (ref 11.6–15.2)

## 2016-02-12 LAB — CULTURE, RESPIRATORY

## 2016-02-12 LAB — URINE CULTURE
CULTURE: NO GROWTH
SPECIAL REQUESTS: NORMAL

## 2016-02-12 LAB — PROCALCITONIN: Procalcitonin: 0.44 ng/mL

## 2016-02-12 LAB — MAGNESIUM
MAGNESIUM: 1.6 mg/dL — AB (ref 1.7–2.4)
Magnesium: 1.8 mg/dL (ref 1.7–2.4)

## 2016-02-12 LAB — HEPARIN LEVEL (UNFRACTIONATED)
HEPARIN UNFRACTIONATED: 0.5 [IU]/mL (ref 0.30–0.70)
Heparin Unfractionated: 0.46 IU/mL (ref 0.30–0.70)

## 2016-02-12 LAB — CULTURE, RESPIRATORY W GRAM STAIN

## 2016-02-12 LAB — GLUCOSE, CAPILLARY
GLUCOSE-CAPILLARY: 379 mg/dL — AB (ref 65–99)
Glucose-Capillary: 281 mg/dL — ABNORMAL HIGH (ref 65–99)
Glucose-Capillary: 308 mg/dL — ABNORMAL HIGH (ref 65–99)
Glucose-Capillary: 315 mg/dL — ABNORMAL HIGH (ref 65–99)
Glucose-Capillary: 356 mg/dL — ABNORMAL HIGH (ref 65–99)
Glucose-Capillary: 360 mg/dL — ABNORMAL HIGH (ref 65–99)

## 2016-02-12 LAB — PHOSPHORUS
PHOSPHORUS: 2.7 mg/dL (ref 2.5–4.6)
Phosphorus: 1.8 mg/dL — ABNORMAL LOW (ref 2.5–4.6)

## 2016-02-12 MED ORDER — INSULIN GLARGINE 100 UNIT/ML ~~LOC~~ SOLN
15.0000 [IU] | Freq: Every day | SUBCUTANEOUS | Status: DC
  Administered 2016-02-12 – 2016-02-13 (×2): 15 [IU] via SUBCUTANEOUS
  Filled 2016-02-12 (×3): qty 0.15

## 2016-02-12 MED ORDER — POTASSIUM CHLORIDE 10 MEQ/50ML IV SOLN
10.0000 meq | INTRAVENOUS | Status: AC
  Administered 2016-02-12 (×6): 10 meq via INTRAVENOUS
  Filled 2016-02-12 (×6): qty 50

## 2016-02-12 MED ORDER — SODIUM PHOSPHATE 3 MMOLE/ML IV SOLN
30.0000 mmol | Freq: Once | INTRAVENOUS | Status: AC
  Administered 2016-02-12: 30 mmol via INTRAVENOUS
  Filled 2016-02-12: qty 10

## 2016-02-12 MED ORDER — MAGNESIUM SULFATE 4 GM/100ML IV SOLN
4.0000 g | Freq: Once | INTRAVENOUS | Status: AC
  Administered 2016-02-12: 4 g via INTRAVENOUS
  Filled 2016-02-12 (×2): qty 100

## 2016-02-12 MED ORDER — FLUDROCORTISONE ACETATE 0.1 MG PO TABS
0.1000 mg | ORAL_TABLET | Freq: Every day | ORAL | Status: DC
  Administered 2016-02-12 – 2016-02-15 (×4): 0.1 mg via ORAL
  Filled 2016-02-12 (×8): qty 1

## 2016-02-12 MED ORDER — DEXMEDETOMIDINE HCL IN NACL 400 MCG/100ML IV SOLN: 0.4000 ug/kg/h | INTRAVENOUS | Status: DC

## 2016-02-12 MED ORDER — HEPARIN (PORCINE) IN NACL 100-0.45 UNIT/ML-% IJ SOLN
700.0000 [IU]/h | INTRAMUSCULAR | Status: DC
  Administered 2016-02-12 – 2016-02-14 (×3): 1000 [IU]/h via INTRAVENOUS
  Filled 2016-02-12 (×3): qty 250

## 2016-02-12 MED ORDER — POTASSIUM CHLORIDE 10 MEQ/50ML IV SOLN
10.0000 meq | INTRAVENOUS | Status: AC
  Administered 2016-02-12 (×6): 10 meq via INTRAVENOUS
  Filled 2016-02-12 (×7): qty 50

## 2016-02-12 MED ORDER — POTASSIUM CHLORIDE 20 MEQ/15ML (10%) PO SOLN
40.0000 meq | ORAL | Status: AC
  Administered 2016-02-12 (×3): 40 meq via ORAL
  Filled 2016-02-12 (×3): qty 30

## 2016-02-12 MED ORDER — POTASSIUM CHLORIDE 20 MEQ/15ML (10%) PO SOLN
40.0000 meq | Freq: Once | ORAL | Status: AC
  Administered 2016-02-12: 40 meq via ORAL
  Filled 2016-02-12: qty 30

## 2016-02-12 NOTE — Progress Notes (Signed)
CRITICAL VALUE ALERT  Critical value received: K 2.4  Date of notification: 3/30  Time of notification: 0218  Critical value read back:Yes.    Nurse who received alert:  Kathlen ModyJ Adela Esteban  MD notified (1st page):  Kasa  Time of first page:  0220  MD notified (2nd page):  Time of second page:  Responding MD: Belia HemanKasa  Time MD responded: [phone]

## 2016-02-12 NOTE — Progress Notes (Signed)
Notified Elink MD of critical potassium result 2.4 in setting of aggressive IV diuresis with lasix gtt.  Order received for 6 x runs of K 10 meq.   Informed Elink of pt's UO this shift and net I/O.  VS remain stable, slowly titrating down on phenylephrine gtt, continuing to monitor.

## 2016-02-12 NOTE — Progress Notes (Signed)
ANTICOAGULATION CONSULT NOTE - Follow Up Consult  Pharmacy Consult for Heparin Indication: pulmonary embolus  No Known Allergies  Patient Measurements: Height: 5\' 1"  (154.9 cm) Weight: 146 lb 9.7 oz (66.5 kg) IBW/kg (Calculated) : 47.8 Heparin Dosing Weight: 62 kg  Vital Signs: Temp: 98.2 F (36.8 C) (03/30 1135) Temp Source: Oral (03/30 1135) BP: 111/62 mmHg (03/30 1200) Pulse Rate: 68 (03/30 1200)  Labs:  Recent Labs  02/09/16 1940  02/10/16 0038  02/10/16 [id number] 02/11/16 0335 02/11/16 1357 02/12/16 0148 02/12/16 0613 02/12/16 1145  HGB  --   < > 9.4*  --   --  9.0*  --   --  10.3*  --   HCT  --   --  29.8*  --   --  28.8*  --   --  32.6*  --   PLT  --   --  166  --   --  231  --   --  233  --   LABPROT  --   --   --   --   --   --   --  26.6*  --   --   INR  --   --   --   --   --   --   --  2.49*  --   --   HEPARINUNFRC  --   --  0.66  < >  --  0.62  --  0.50  --  0.46  CREATININE  --   --  0.55  --   --  0.59 0.54 0.64  --   --   TROPONINI 0.10*  --  0.09*  --  0.06*  --   --   --   --   --   < > = values in this interval not displayed.  Estimated Creatinine Clearance: 75.9 mL/min (by C-G formula based on Cr of 0.64).   Assessment: [age] YOF who presented [facility] on 01/29/16 with likely EtOH pancreatitis and ARDS for ventilator management. She was started on IV heparin for acute PE and is now s/p EKOS. Heparin level remains at goal. No bleeding noted.   Goal of Therapy:  Heparin level 0.3-0.7 units/ml Monitor platelets by anticoagulation protocol: Yes   Plan:  - Continue heparin gtt at 1000 units/hr  - Daily heparin level and CBC  Lysle Pearlachel Maylea Soria, PharmD, BCPS Pager # [phone] 02/12/2016 1:02 PM

## 2016-02-12 NOTE — Progress Notes (Signed)
Per MD request

## 2016-02-12 NOTE — Progress Notes (Signed)
MD contacted with critical values. Vent settings changed per request

## 2016-02-12 NOTE — Progress Notes (Addendum)
PULMONARY / CRITICAL CARE MEDICINE   Name: Abigail Baker MRN: [id number] DOB: October 16, 1966    ADMISSION DATE:  02-10-16  CHIEF COMPLAINT:  Pancreatitis  HISTORY OF PRESENT ILLNESS:   50 yo F with h/o DM, Etoh abuse, who presented to [facility] on 01/29/2016 after several weeks of abdominal pain, n/v.  She was found to have most likely EtOH pancreatitis, she developed ARDS and reportedly b/l PNA for which she was treated with abx.  She also developed a pancreatic pseudocyst.  She was transferred to [facility] hospital for 24/7 CCM and advanced ventilator management given bouts of hypoxemia despite high PEEP/FiO2  SUBJECTIVE:  Improved peep, fio2 Neg 10.7 liters on lasix drip  VITAL SIGNS: Temp:  [97.5 F (36.4 C)-99.4 F (37.4 C)] 98.2 F (36.8 C) (03/30 1135) Pulse Rate:  [58-95] 68 (03/30 1200) Resp:  [0-18] 16 (03/30 1200) BP: (95-125)/(49-86) 111/62 mmHg (03/30 1200) SpO2:  [68 %-100 %] 90 % (03/30 1200) Arterial Line BP: (93-157)/(44-79) 112/53 mmHg (03/30 1200) FiO2 (%):  [70 %-100 %] 70 % (03/30 1159) Weight:  [66.5 kg (146 lb 9.7 oz)] 66.5 kg (146 lb 9.7 oz) (03/30 0500) HEMODYNAMICS:   VENTILATOR SETTINGS: Vent Mode:  [-] PCV FiO2 (%):  [70 %-100 %] 70 % Set Rate:  [6 bmp-16 bmp] 6 bmp PEEP:  [8 cmH20-12 cmH20] 8 cmH20 Plateau Pressure:  [26 cmH20-33 cmH20] 32 cmH20 INTAKE / OUTPUT:  Intake/Output Summary (Last 24 hours) at 02/12/16 1222 Last data filed at 02/12/16 1130  Gross per 24 hour  Intake 3305.84 ml  Output  40981 ml  Net -12959.16 ml    PHYSICAL EXAMINATION: General:  Sedated. No distress Neuro:  Intubated and sedated. Pupils equal. HEENT:  Endotracheal tube in place, jvd down Cardiovascular:  Regular rate. Anasarca improved Normal S1 & S2. Lungs:  Course resolving, reduced bases Abdomen:  Soft. Nondistended. BS wnl Integument:  Warm & Dry. No rash on exposed skin.  LABS:  CBC  Recent Labs Lab 02/10/16 0038 02/11/16 0335 02/12/16 0613  WBC  17.2* 18.7* 18.4*  HGB 9.4* 9.0* 10.3*  HCT 29.8* 28.8* 32.6*  PLT 166 231 233   Coag's  Recent Labs Lab 10-Feb-2016 2305 02/12/16 0148  APTT 38*  --   INR 1.34 2.49*   BMET  Recent Labs Lab 02/11/16 0335 02/11/16 1357 02/12/16 0148  NA 137 136 138  K 3.8 3.4* 2.4*  CL 105 106 94*  CO2 18* 21* 32  BUN CREATININE 0.59 0.54 0.64  GLUCOSE 231* 196* 330*   Electrolytes  Recent Labs Lab 02/11/16 0335 02/11/16 1357 02/12/16 0148  CALCIUM 7.6* 7.4* 7.4*  MG 2.1 1.9 1.8  PHOS 2.6 2.0* 2.7   Sepsis Markers  Recent Labs Lab February 10, 2016 2305 02/11/16 0335 02/12/16 0148  LATICACIDVEN 1.6  --   --   PROCALCITON 0.14 0.36 0.44   ABG  Recent Labs Lab 02/09/16 1847 02/10/16 0818 02/11/16 0131  PHART 7.300* 7.281* 7.341*  PCO2ART 44.5 37.1 34.2*  PO2ART 58.0* 87.0 71.0*   Liver Enzymes  Recent Labs Lab 02/10/16 0038 02/11/16 0335 02/12/16 0148  AST 67* 118* 129*  ALT 25 28 38  ALKPHOS 87 96 105  BILITOT 0.5 0.7 0.8  ALBUMIN 1.6* 1.4* 1.6*   Cardiac Enzymes  Recent Labs Lab 02/09/16 1940 02/10/16 0038 02/10/16 0634  TROPONINI 0.10* 0.09* 0.06*   Glucose  Recent Labs Lab 02/11/16 1532 02/11/16 2008 02/11/16 2337 02/12/16 0325 02/12/16 0801 02/12/16 1133  GLUCAP  170* 256* 315* 308* 281* 379*    Imaging Dg Chest Port 1 View  02/12/2016  CLINICAL DATA:  50 year old female with an ARDS. Subsequent encounter. EXAM: PORTABLE CHEST 1 VIEW COMPARISON:  02/11/2016. FINDINGS: Endotracheal tube tip 2.2 cm above the carina. Right central line tip cavoatrial junction level. Feeding tube in place.  Tip not imaged on the current exam. Diffuse airspace disease with bilateral pleural effusions minimally improved from prior exam. No gross pneumothorax. Heart size within normal limits. Infusion catheter removed. IMPRESSION: Endotracheal tube tip 2.2 cm above the carina. Right central line tip cavoatrial junction level. Diffuse airspace disease with  bilateral pleural effusions minimally improved from prior exam. Infusion catheter removed. Electronically Signed   By: Lacy DuverneySteven  Olson M.D.   On: 02/12/2016 07:25   Ir Rande Lawmanhromb F/u Eval Art/ven Final Day (ms)  02/11/2016  INDICATION: 50 year old female with alcoholic pancreatitis, acute respiratory failure secondary to ARDS. Hospital course complicated fine large acute right pulmonary embolus with right heart strain. She is now status post right PE lysis with the EKOS infusion catheter. EXAM: IR THROMB F/U EVAL ART/VEN FINAL DAY MEDICATIONS: None. ANESTHESIA/SEDATION: None. The patient's level of consciousness and vital signs were monitored continuously by radiology nursing throughout the procedure under my direct supervision. CONTRAST:  None. FLUOROSCOPY TIME:  Fluoroscopy Time: 6 seconds (0.2 mGy). COMPLICATIONS: None immediate. PROCEDURE: Informed consent was obtained from the patient following explanation of the procedure, risks, benefits and alternatives. The patient understands, agrees and consents for the procedure. All questions were addressed. A time out was performed prior to the initiation of the procedure. Maximal barrier sterile technique utilized including caps, mask, sterile gowns, sterile gloves, large sterile drape, hand hygiene, and Betadine prep. Under sterile conditions, the existing right pulmonary artery EKOS infusion wire was removed. PA pressure measurements were obtained through the infusion catheter. Patient is now status post 12 hour protocol for right pulmonary embolus EKOS lysis. Post lysis right PA pressure:  30/16 (22) IMPRESSION: Successful completion of the right PE lysis with the EKOS infusion catheter. Post lysis right PA pressure 30/16 (22) Electronically Signed   By: Judie PetitM.  Shick M.D.   On: 02/11/2016 14:59   EVENTS: 3/16 - Admit to [facility] 3/19 - Worsening hypoxia & altered mentation. Transferred to ICU. Failed trial of BiPAP & was intubated. 3/26 - [facility]  PE noted, EKOS perfromed  STUDIES: CT ABD/PELVIS OSH 3/23:  Subacute pancreatitis w/ 6.9x3.6cm pseudocyst in lesser sac & 7mm pseudocyst in tail. Moderate bilateral pleural effusions.  Port CXR 3/26:  Personally reviewed by me. R IJ at cavoatrial junction. ETT 2cm above carina. Bilateral opacities. CTA Chest 3/27:  Moderate bilateral pleural effusions. Diffuse lung opacities. Small PE left lower lobe branch & large central PE right pulmonary artery w/ RV/LV ratio 1.1. KUB 3/27:  Feeding tube in stomach.  MICROBIOLOGY: MRSA PCR 3/26:  Negative  3/27- candidia  ANTIBIOTICS: None.  LINES/TUBES: OETT [place] 3/19>>> R IJ CVL 3/19 or 3/20>>> OGT>>> Foley>>> R Radial Art Line 3/27>>>  ASSESSMENT / PLAN:  PULMONARY A: Bilateral PE - Large R Central PE. ARDS Bilateral Pleural Effusions Tobacco Use  P:   Hep drip remain abg now Goal to peep 10, 60% for trach Consider trach delat , given INR and reluctance to ffp , vit K  pcxr is improved, with neg balance, repeat in am   CARDIOVASCULAR A:  Shock apparently looking like sedation related Anasarca  P:  Neo-Synephrine to maintain MAP>>60 Get cortisol 12,  maintain stress steroids and add flroinef  RENAL A:   NonAGMA AGMA - Resolved. Hypomagnesemia - Resolved. ARDS P:   Lasix drip reduce to 5 mg/hr k supp aggressive Mag supp kvo Bmet q12h  GASTROINTESTINAL A:   Acute EtOH Pancreatitis Pancreatic Pseudocysts - x2 per OSH CT. Severe Protein-Calorie Malnutrition  P:   Protonix IV q24hr T f tolerated, NPO 5 am  HEMATOLOGIC A:   PE Anemia - No signs of active bleeding. S/P previous transfusion for Hgb of 8. Leukocytosis - Increasing.  Thrombocytopenia - Resolved coagulapthy sepsis? Consumption?   P:  Heparin drip per pharmacy protocol, hold 5 am for trach FFP low dose and vit k to hold off as only 2 days out from EKOS, may need to hold trach plans till next week Get dopplers upper and lower, uppers first  with edema rt greater left  INFECTIOUS A:   Fever 3/28 On multiple antibiotics previously.  P:   Pct neg x 3 Dc vanc Zosyn okay for now, likely to dc this in am  ENDOCRINE A:   H/O DM Type I R/o rel AI P:   Accu-Checks q4hr  SSI per algorithm Add lantus  NEUROLOGIC A:   Sedation on Ventilator H/O EtOH Use  P:   RASS goal: 0 to -1 Fentanly gtt & IV prn Versed gtt & IV prn Seroquel  qhs- dc Thiamine & FA IV daily WUA  needed  Ccm time 35 min   Mom updated  Mcarthur Rossetti. Tyson Alias, MD, FACP Pgr: [phone] [facility]

## 2016-02-12 NOTE — Progress Notes (Signed)
ANTICOAGULATION CONSULT NOTE - Follow Up Consult  Pharmacy Consult for Heparin  Indication: pulmonary embolus  No Known Allergies  Patient Measurements: Height: 5\' 1"  (154.9 cm) Weight: 166 lb 0.1 oz (75.3 kg) IBW/kg (Calculated) : 47.8  Vital Signs: Temp: 98.4 F (36.9 C) (03/30 0323) Temp Source: Oral (03/30 0323) BP: 119/70 mmHg (03/30 0400) Pulse Rate: 82 (03/30 0400)  Labs:  Recent Labs  02/09/16 1940  02/10/16 0038 02/10/16 [id number] 02/10/16 0634 02/11/16 0335 02/11/16 1357 02/12/16 0148  HGB  --   --  9.4*  --   --  9.0*  --   --   HCT  --   --  29.8*  --   --  28.8*  --   --   PLT  --   --  166  --   --  231  --   --   LABPROT  --   --   --   --   --   --   --  26.6*  INR  --   --   --   --   --   --   --  2.49*  HEPARINUNFRC  --   < > 0.66 0.68  --  0.62  --  0.50  CREATININE  --   --  0.55  --   --  0.59 0.54 0.64  TROPONINI 0.10*  --  0.09*  --  0.06*  --   --   --   < > = values in this interval not displayed.  Estimated Creatinine Clearance: 80.7 mL/min (by C-G formula based on Cr of 0.64).   Assessment: Heparin level therapeutic x 1 after re-start s/p EKOS protocol   Goal of Therapy:  Heparin level 0.3-0.7 units/ml Monitor platelets by anticoagulation protocol: Yes   Plan:  -Cont heparin at 1000 units/hr -1200 HL -F/U plans for oral AC  Abran DukeLedford, Amire Leazer 02/12/2016,4:08 AM

## 2016-02-12 NOTE — Progress Notes (Signed)
Referring Physician(s): CCM DR Rory Percy  Supervising Physician: Richarda Overlie  Chief Complaint:  Right PE  Thrombolysis 3/28-29  Subjective:  Still on vent Not yet weaning Per RN does seem to be some better No response to me Rt groin clean and dry 2+ pulses  Allergies: Review of patient's allergies indicates no known allergies.  Medications: Prior to Admission medications   Medication Sig Start Date End Date Taking? Authorizing Provider  acetaminophen (TYLENOL) 325 MG tablet Take 650 mg by mouth every 6 (six) hours as needed for mild pain or fever (fever above 100.4).   Yes Historical Provider, MD  acetaminophen (TYLENOL) 650 MG suppository Place 650 mg rectally every 6 (six) hours as needed for mild pain or fever (above 100.4).   Yes Historical Provider, MD  alum & mag hydroxide-simeth (MAALOX/MYLANTA) 200-200-20 MG/5ML suspension Take 30 mLs by mouth every 4 (four) hours as needed for indigestion or heartburn.   Yes Historical Provider, MD  dicyclomine (BENTYL) 20 MG tablet Take 20 mg by mouth every 6 (six) hours. 01/19/16  Yes Historical Provider, MD  docusate sodium (COLACE) 100 MG capsule Take 100 mg by mouth daily as needed for mild constipation.   Yes Historical Provider, MD  enalapril (VASOTEC) 5 MG tablet Take 2.5 mg by mouth every morning. 01/20/16  Yes Historical Provider, MD  enoxaparin (LOVENOX) 40 MG/0.4ML injection Inject 40 mg into the skin daily.   Yes Historical Provider, MD  GLUCERNA (GLUCERNA) LIQD Give 1,000 mLs by tube daily. Start OGT feedin at 50mL/hr, do not titrate   Yes Historical Provider, MD  guaiFENesin-dextromethorphan (ROBITUSSIN DM) 100-10 MG/5ML syrup Take 10 mLs by mouth every 6 (six) hours as needed for cough.   Yes Historical Provider, MD  insulin regular (NOVOLIN R,HUMULIN R) 100 units/mL injection Inject into the skin every 6 (six) hours. Sliding Scale   Yes Historical Provider, MD  levothyroxine (SYNTHROID, LEVOTHROID) 125 MCG tablet  Take 125 mcg by mouth daily before breakfast.   Yes Historical Provider, MD  lipase/protease/amylase (CREON) 12000 units CPEP capsule Take 24,000 Units by mouth 3 (three) times daily with meals.   Yes Historical Provider, MD  LORazepam (ATIVAN) 2 MG/ML injection Inject 2 mg into the vein every 4 (four) hours as needed (withdrawal score 10-12).   Yes Historical Provider, MD  magnesium hydroxide (MILK OF MAGNESIA) 400 MG/5ML suspension Take 30 mLs by mouth daily as needed for mild constipation.   Yes Historical Provider, MD  ondansetron (ZOFRAN) 40 MG/20ML SOLN injection Inject 4 mg into the vein every 6 (six) hours as needed for nausea or vomiting.   Yes Historical Provider, MD  pantoprazole (PROTONIX) 40 MG injection Inject 40 mg into the vein daily.   Yes Historical Provider, MD  Prenatal Vit-Fe Fumarate-FA (PRENATAL MULTIVITAMIN) TABS tablet Take 1 tablet by mouth daily at 12 noon.   Yes Historical Provider, MD  promethazine (PHENERGAN) 25 MG/ML injection Inject 12.5 mg into the vein every 6 (six) hours as needed for nausea or vomiting.   Yes Historical Provider, MD  propranolol (INDERAL) 10 MG tablet Take 10 mg by mouth 2 (two) times daily.   Yes Historical Provider, MD  ranitidine (ZANTAC) 150 MG tablet Take 150 mg by mouth 2 (two) times daily. 01/28/16  Yes Historical Provider, MD  sertraline (ZOLOFT) 100 MG tablet Take 200 mg by mouth daily. 01/20/16  Yes Historical Provider, MD  temazepam (RESTORIL) 15 MG capsule Take 15 mg by mouth at bedtime as needed for sleep (  insomnia).   Yes Historical Provider, MD  thiamine (VITAMIN B-1) 100 MG tablet Take 100 mg by mouth daily.   Yes Historical Provider, MD     Vital Signs: BP 116/68 mmHg  Pulse 65  Temp(Src) 98.2 F (36.8 C) (Oral)  Resp 16  Ht  (1.549 m)  Wt 146 lb 9.7 oz (66.5 kg)  BMI 27.72 kg/m2  SpO2 92%  LMP 01/27/2016  Physical Exam  Pulmonary/Chest:  Intubated---on vent   Abdominal: Soft.  Skin: Skin is warm.  Rt groin NO  bleeding No hematoma Rt foot 2+ pulses  Nursing note and vitals reviewed.   Imaging: Ct Angio Chest Pe W/cm &/or Wo Cm  02/09/2016  CLINICAL DATA:  Acute respiratory failure with hypoxia. EXAM: CT ANGIOGRAPHY CHEST WITH CONTRAST TECHNIQUE: Multidetector CT imaging of the chest was performed using the standard protocol during bolus administration of intravenous contrast. Multiplanar CT image reconstructions and MIPs were obtained to evaluate the vascular anatomy. CONTRAST:  80 mL of Isovue 370 intravenously. COMPARISON:  None. FINDINGS: No pneumothorax is noted. Endotracheal tube is in grossly good position. Moderate bilateral pleural effusions are noted. Diffuse bilateral lung opacities are noted concerning for pneumonia or edema. There is no evidence of thoracic aortic dissection or aneurysm. Small embolus is noted in lower lobe branch of left pulmonary artery. Large embolus is noted in the right pulmonary artery which extends into upper and lower lobe branches. Moderate anasarca is noted. RV/LV ratio of 1.1 is noted. Multiple old left rib fractures are noted. Review of the MIP images confirms the above findings. IMPRESSION: Diffuse bilateral lung opacities are noted concerning for edema or pneumonia. Moderate bilateral pleural effusions are noted. Moderate anasarca is noted. Small pulmonary embolus is noted peripherally in lower lobe branch of left pulmonary artery. Large central embolus is noted in the right pulmonary artery. Positive for acute PE with CT evidence of right heart strain (RV/LV Ratio = 1.1) consistent with at least submassive (intermediate risk) PE. The presence of right heart strain has been associated with an increased risk of morbidity and mortality. Please activate Code PE by paging [phone]. Critical Value/emergent results were called by telephone at the time of interpretation on 02/09/2016 at 5:05 pm to [place] K[place], patient's nurse, who verbally acknowledged these results and will  immediately contact the ordering physician. Electronically Signed   By: Lupita Raider, M.D.   On: 02/09/2016 17:06   Ir Angiogram Pulmonary Right Selective  02/11/2016  INDICATION: 50 year old female with alcoholic pancreatitis and sudden onset respiratory failure at an outside hospital. CT imaging confirms large volume right-sided pulmonary embolus with evidence right heart strain. Additionally, she has significant airspace disease bilaterally and bilateral layering pleural effusions. She presents to interventional radiology for potential right-sided thoracentesis if there is sufficient fluid as well as right-sided PE lysis. EXAM: IR INFUSION THROMBOL VENOUS INITIAL (MS); IR ULTRASOUND GUIDANCE VASC ACCESS RIGHT; RIGHT PULMONARY ARTERIOGRAPHY; ADDITIONAL ARTERIOGRAPHY COMPARISON:  CT chest 02/09/2016 MEDICATIONS: None. ANESTHESIA/SEDATION: Versed 1 mg IV; Fentanyl 50 mcg IV Moderate Sedation Time:  30 The patient was continuously monitored during the procedure by the interventional radiology nurse under my direct supervision. FLUOROSCOPY TIME:  Fluoroscopy Time: 8 minutes 24 seconds (28 mGy). COMPLICATIONS: None immediate. TECHNIQUE: Informed written consent was obtained from the patient after a thorough discussion of the procedural risks, benefits and alternatives. All questions were addressed. Maximal Sterile Barrier Technique was utilized including caps, mask, sterile gowns, sterile gloves, sterile drape, hand hygiene and skin antiseptic. A  timeout was performed prior to the initiation of the procedure. The right chest was interrogated with ultrasound. There is only trace pleural fluid. No safe window from a lateral approach for thoracentesis. Attention was turned to the right groin. The right groin was interrogated with ultrasound. The common femoral vein is patent. There is no evidence of thrombus. Image was obtained and stored for the medical record. Using real-time sonographic guidance, the vessels  punctured with a 21 gauge micropuncture needle. Using standard technique, the initial micro wire was exchanged through a transitional 5 [place] micro sheath for a Bentson wire. A 6 French vascular sheath was then advanced over the Bentson wire and into the common femoral vein. An angled catheter was advanced over the Bentson wire and into the right heart. The Bentson wire was exchanged for a Glidewire which was then navigated into the main pulmonary artery. The main pulmonary arteriogram was performed. This confirms a large filling defect within the right main pulmonary artery consistent with acute pulmonary embolus. There is significantly decreased perfusion to the right lung. The catheter was next navigated into the right main pulmonary artery. Pressures were obtained. The right pulmonary arterial pressure is 42/20 (mean 30). A right pulmonary arteriogram was performed. Again, there is a large filling defect in the right main pulmonary artery extending into the upper lobe branches. There is significantly decreased perfusion in the right lung. The angled catheter was successfully navigated through the thrombus in into a branch of the right lower lobe pulmonary artery. The glidewire was exchanged for a Rosen wire. A 12 cm infusion length EKOS catheter was advanced over the wire and positioned across the thrombus. Pulmonary arterial thrombolysis was then initiated at a rate of 1 milligram/hour. The catheters were secured in place and a sterile bandage applied. The patient tolerated the procedure well and was returned to the intensive care unit. FINDINGS: Insufficient right-sided pleural fluid for thoracentesis. Right main pulmonary arterial pressure 42/20 (30) mm Hg Large volume thrombus within the right main pulmonary artery extending into right upper and lower lobar branches with significantly decreased perfusion to the right lung. IMPRESSION: 1. Insufficient right-sided pleural fluid for thoracentesis.  Thoracentesis was not performed. 2. Pulmonary arterial hypertension with a right main pulmonary arterial pressure of 42/20 (30) mm Hg. 3. Initiation of right-sided pulmonary arterial thrombolysis at a rate of 1 milligram/hour which will continue for a total dose of 24 mg. Signed, Sterling Big, MD [facility] Radiology Electronically Signed   By: Malachy Moan M.D.   On: 02/11/2016 10:09   Ir Angiogram Selective Each Additional Vessel  02/11/2016  INDICATION: 50 year old female with alcoholic pancreatitis and sudden onset respiratory failure at an outside hospital. CT imaging confirms large volume right-sided pulmonary embolus with evidence right heart strain. Additionally, she has significant airspace disease bilaterally and bilateral layering pleural effusions. She presents to interventional radiology for potential right-sided thoracentesis if there is sufficient fluid as well as right-sided PE lysis. EXAM: IR INFUSION THROMBOL VENOUS INITIAL (MS); IR ULTRASOUND GUIDANCE VASC ACCESS RIGHT; RIGHT PULMONARY ARTERIOGRAPHY; ADDITIONAL ARTERIOGRAPHY COMPARISON:  CT chest 02/09/2016 MEDICATIONS: None. ANESTHESIA/SEDATION: Versed 1 mg IV; Fentanyl 50 mcg IV Moderate Sedation Time:  30 The patient was continuously monitored during the procedure by the interventional radiology nurse under my direct supervision. FLUOROSCOPY TIME:  Fluoroscopy Time: 8 minutes 24 seconds (28 mGy). COMPLICATIONS: None immediate. TECHNIQUE: Informed written consent was obtained from the patient after a thorough discussion of the procedural risks, benefits and  alternatives. All questions were addressed. Maximal Sterile Barrier Technique was utilized including caps, mask, sterile gowns, sterile gloves, sterile drape, hand hygiene and skin antiseptic. A timeout was performed prior to the initiation of the procedure. The right chest was interrogated with ultrasound. There is only trace  pleural fluid. No safe window from a lateral approach for thoracentesis. Attention was turned to the right groin. The right groin was interrogated with ultrasound. The common femoral vein is patent. There is no evidence of thrombus. Image was obtained and stored for the medical record. Using real-time sonographic guidance, the vessels punctured with a 21 gauge micropuncture needle. Using standard technique, the initial micro wire was exchanged through a transitional 5 [place] micro sheath for a Bentson wire. A 6 French vascular sheath was then advanced over the Bentson wire and into the common femoral vein. An angled catheter was advanced over the Bentson wire and into the right heart. The Bentson wire was exchanged for a Glidewire which was then navigated into the main pulmonary artery. The main pulmonary arteriogram was performed. This confirms a large filling defect within the right main pulmonary artery consistent with acute pulmonary embolus. There is significantly decreased perfusion to the right lung. The catheter was next navigated into the right main pulmonary artery. Pressures were obtained. The right pulmonary arterial pressure is 42/20 (mean 30). A right pulmonary arteriogram was performed. Again, there is a large filling defect in the right main pulmonary artery extending into the upper lobe branches. There is significantly decreased perfusion in the right lung. The angled catheter was successfully navigated through the thrombus in into a branch of the right lower lobe pulmonary artery. The glidewire was exchanged for a Rosen wire. A 12 cm infusion length EKOS catheter was advanced over the wire and positioned across the thrombus. Pulmonary arterial thrombolysis was then initiated at a rate of 1 milligram/hour. The catheters were secured in place and a sterile bandage applied. The patient tolerated the procedure well and was returned to the intensive care unit. FINDINGS: Insufficient right-sided pleural  fluid for thoracentesis. Right main pulmonary arterial pressure 42/20 (30) mm Hg Large volume thrombus within the right main pulmonary artery extending into right upper and lower lobar branches with significantly decreased perfusion to the right lung. IMPRESSION: 1. Insufficient right-sided pleural fluid for thoracentesis. Thoracentesis was not performed. 2. Pulmonary arterial hypertension with a right main pulmonary arterial pressure of 42/20 (30) mm Hg. 3. Initiation of right-sided pulmonary arterial thrombolysis at a rate of 1 milligram/hour which will continue for a total dose of 24 mg. Signed, Sterling Big, MD [facility] Radiology Electronically Signed   By: Malachy Moan M.D.   On: 02/11/2016 10:09   Ir US Guide Vasc Access Right  02/11/2016  INDICATION: 50 year old female with alcoholic pancreatitis and sudden onset respiratory failure at an outside hospital. CT imaging confirms large volume right-sided pulmonary embolus with evidence right heart strain. Additionally, she has significant airspace disease bilaterally and bilateral layering pleural effusions. She presents to interventional radiology for potential right-sided thoracentesis if there is sufficient fluid as well as right-sided PE lysis. EXAM: IR INFUSION THROMBOL VENOUS INITIAL (MS); IR ULTRASOUND GUIDANCE VASC ACCESS RIGHT; RIGHT PULMONARY ARTERIOGRAPHY; ADDITIONAL ARTERIOGRAPHY COMPARISON:  CT chest 02/09/2016 MEDICATIONS: None. ANESTHESIA/SEDATION: Versed 1 mg IV; Fentanyl 50 mcg IV Moderate Sedation Time:  30 The patient was continuously monitored during the procedure by the interventional radiology nurse under my direct supervision. FLUOROSCOPY TIME:  Fluoroscopy Time: 8 minutes  24 seconds (28 mGy). COMPLICATIONS: None immediate. TECHNIQUE: Informed written consent was obtained from the patient after a thorough discussion of the procedural risks, benefits and alternatives. All  questions were addressed. Maximal Sterile Barrier Technique was utilized including caps, mask, sterile gowns, sterile gloves, sterile drape, hand hygiene and skin antiseptic. A timeout was performed prior to the initiation of the procedure. The right chest was interrogated with ultrasound. There is only trace pleural fluid. No safe window from a lateral approach for thoracentesis. Attention was turned to the right groin. The right groin was interrogated with ultrasound. The common femoral vein is patent. There is no evidence of thrombus. Image was obtained and stored for the medical record. Using real-time sonographic guidance, the vessels punctured with a 21 gauge micropuncture needle. Using standard technique, the initial micro wire was exchanged through a transitional 5 [place] micro sheath for a Bentson wire. A 6 French vascular sheath was then advanced over the Bentson wire and into the common femoral vein. An angled catheter was advanced over the Bentson wire and into the right heart. The Bentson wire was exchanged for a Glidewire which was then navigated into the main pulmonary artery. The main pulmonary arteriogram was performed. This confirms a large filling defect within the right main pulmonary artery consistent with acute pulmonary embolus. There is significantly decreased perfusion to the right lung. The catheter was next navigated into the right main pulmonary artery. Pressures were obtained. The right pulmonary arterial pressure is 42/20 (mean 30). A right pulmonary arteriogram was performed. Again, there is a large filling defect in the right main pulmonary artery extending into the upper lobe branches. There is significantly decreased perfusion in the right lung. The angled catheter was successfully navigated through the thrombus in into a branch of the right lower lobe pulmonary artery. The glidewire was exchanged for a Rosen wire. A 12 cm infusion length EKOS catheter was advanced over the wire and  positioned across the thrombus. Pulmonary arterial thrombolysis was then initiated at a rate of 1 milligram/hour. The catheters were secured in place and a sterile bandage applied. The patient tolerated the procedure well and was returned to the intensive care unit. FINDINGS: Insufficient right-sided pleural fluid for thoracentesis. Right main pulmonary arterial pressure 42/20 (30) mm Hg Large volume thrombus within the right main pulmonary artery extending into right upper and lower lobar branches with significantly decreased perfusion to the right lung. IMPRESSION: 1. Insufficient right-sided pleural fluid for thoracentesis. Thoracentesis was not performed. 2. Pulmonary arterial hypertension with a right main pulmonary arterial pressure of 42/20 (30) mm Hg. 3. Initiation of right-sided pulmonary arterial thrombolysis at a rate of 1 milligram/hour which will continue for a total dose of 24 mg. Signed, Sterling Big, MD [facility] Radiology Electronically Signed   By: Malachy Moan M.D.   On: 02/11/2016 10:09   Dg Chest Port 1 View  02/12/2016  CLINICAL DATA:  50 year old female with an ARDS. Subsequent encounter. EXAM: PORTABLE CHEST 1 VIEW COMPARISON:  02/11/2016. FINDINGS: Endotracheal tube tip 2.2 cm above the carina. Right central line tip cavoatrial junction level. Feeding tube in place.  Tip not imaged on the current exam. Diffuse airspace disease with bilateral pleural effusions minimally improved from prior exam. No gross pneumothorax. Heart size within normal limits. Infusion catheter removed. IMPRESSION: Endotracheal tube tip 2.2 cm above the carina. Right central line tip cavoatrial junction level. Diffuse airspace disease with bilateral pleural effusions minimally improved from prior exam. Infusion  catheter removed. Electronically Signed   By: Lacy Duverney M.D.   On: 02/12/2016 07:25   Dg Chest Port 1 View  02/11/2016  CLINICAL DATA:   Followup endotracheal tube positioning. Subsequent encounter. EXAM: PORTABLE CHEST 1 VIEW COMPARISON:  Chest radiograph and CTA of the chest performed 02/09/2016 FINDINGS: The patient's endotracheal tube is seen ending 2 cm above the carina. Enteric tubes are noted extending below the diaphragm. A right IJ line is noted ending about the proximal right atrium. Small bilateral pleural effusions are noted, more prominent than on the prior study, with bilateral airspace opacification, likely reflecting pulmonary edema. No pneumothorax is seen. The cardiomediastinal silhouette is borderline normal in size. No acute osseous abnormalities are identified. IMPRESSION: 1. Endotracheal tube noted ending 2 cm above the carina. 2. Small bilateral pleural effusions, more prominent than on the prior study, with bilateral airspace opacification, likely reflecting worsening pulmonary edema. Pneumonia could have a similar appearance. Electronically Signed   By: Roanna Raider M.D.   On: 02/11/2016 06:59   Dg Chest Port 1 View  02/09/2016  CLINICAL DATA:  Acute respiratory failure with hypoxia. EXAM: PORTABLE CHEST 1 VIEW COMPARISON:  Chest x-ray dated 02-15-2016. FINDINGS: Endotracheal tube remains well positioned with tip just above the level of the carina. Enteric tube passes below the diaphragm. Right IJ central line is well positioned with tip at the level of the lower SVC. Heart size is normal. Overall cardiomediastinal silhouette is stable in size and configuration. The diffuse bilateral pulmonary edema pattern is stable, perhaps slightly improved aeration bilaterally. Dense opacities at each lung base are unchanged, most likely atelectasis and/or small effusions. IMPRESSION: Stable exam with diffuse bilateral pulmonary edema pattern versus ARDS, perhaps mildly improved aeration bilaterally. Persistent bibasilar atelectasis and/or small effusions. Tubes and lines remain well-positioned. A left lateral rib fracture was  suspected on yesterday's exam, better seen on the previous chest x-ray. Electronically Signed   By: Bary Richard M.D.   On: 02/09/2016 13:54   Dg Chest Port 1 View  02-15-16  CLINICAL DATA:  Endotracheal tube placement.  Initial encounter. EXAM: PORTABLE CHEST 1 VIEW COMPARISON:  None. FINDINGS: The patient's endotracheal tube is seen ending 1-2 cm above the carina. This could be retracted 1-2 cm, as deemed clinically appropriate. A right IJ line is noted ending about the cavoatrial junction. The patient's enteric tube is noted extending below the diaphragm. Diffuse bilateral airspace opacification may reflect pulmonary edema or pneumonia. ARDS cannot be excluded. Small bilateral pleural effusions are suspected. No pneumothorax is seen. The cardiomediastinal silhouette is normal in size. There is suggestion of a minimally displaced fracture of the left lateral fifth rib. IMPRESSION: 1. Endotracheal tube seen ending 1-2 cm above the carina. This could be retracted 1-2 cm, as deemed clinically appropriate. 2. Right IJ line noted ending about the cavoatrial junction. 3. Diffuse bilateral airspace opacification may reflect pulmonary edema or pneumonia. ARDS cannot be excluded. Small bilateral pleural effusions suspected. 4. Suggestion of a minimally displaced fracture of the left lateral fifth rib. Electronically Signed   By: Roanna Raider M.D.   On: 02/15/16 23:23   Dg Abd Portable 1v  02/10/2016  CLINICAL DATA:  Feeding tube placement. EXAM: PORTABLE ABDOMEN - 1 VIEW COMPARISON:  02/09/2016 FINDINGS: There is normal small bowel gas pattern. NG tube in place with tip in mid stomach. There is a NG feeding tube with tip in distal stomach or proximal duodenum. IMPRESSION: NG tube in place with tip in mid stomach.  There is a NG feeding tube with tip in distal stomach or proximal duodenum. Electronically Signed   By: Natasha MeadLiviu  Pop M.D.   On: 02/10/2016 13:09   Dg Abd Portable 1v  02/09/2016  CLINICAL DATA:   Feeding tube placement EXAM: PORTABLE ABDOMEN - 1 VIEW COMPARISON:  Portable exam 1812 hours without priors for comparison FINDINGS: Tip of nasogastric tube projects over mid stomach. Tip of feeding tube projects over distal gastric antrum with redundant tubing within stomach. Retained contrast in colon. BILATERAL pulmonary infiltrates. Excreted contrast material within renal collecting systems. IMPRESSION: Tip of feeding tube projects over distal gastric antrum region. Diffuse BILATERAL pulmonary infiltrates. Electronically Signed   By: Ulyses SouthwardMark  Boles M.D.   On: 02/09/2016 19:07   Ct Portable Head W/o Cm  02/10/2016  CLINICAL DATA:  Acute encephalopathy EXAM: CT HEAD WITHOUT CONTRAST TECHNIQUE: Contiguous axial images were obtained from the base of the skull through the vertex without intravenous contrast. COMPARISON:  None. FINDINGS: Bony calvarium is intact. Endotracheal tube and nasogastric catheter are noted. No findings to suggest acute hemorrhage are seen. No acute infarct is noted. IMPRESSION: No acute abnormality is noted at this time. Electronically Signed   By: Alcide CleverMark  Lukens M.D.   On: 02/10/2016 11:29   Ir Infusion Thrombol Venous Initial (ms)  02/11/2016  INDICATION: 50 year old female with alcoholic pancreatitis and sudden onset respiratory failure at an outside hospital. CT imaging confirms large volume right-sided pulmonary embolus with evidence right heart strain. Additionally, she has significant airspace disease bilaterally and bilateral layering pleural effusions. She presents to interventional radiology for potential right-sided thoracentesis if there is sufficient fluid as well as right-sided PE lysis. EXAM: IR INFUSION THROMBOL VENOUS INITIAL (MS); IR ULTRASOUND GUIDANCE VASC ACCESS RIGHT; RIGHT PULMONARY ARTERIOGRAPHY; ADDITIONAL ARTERIOGRAPHY COMPARISON:  CT chest 02/09/2016 MEDICATIONS: None. ANESTHESIA/SEDATION: Versed 1 mg IV; Fentanyl 50 mcg IV Moderate Sedation Time:  30 The patient  was continuously monitored during the procedure by the interventional radiology nurse under my direct supervision. FLUOROSCOPY TIME:  Fluoroscopy Time: 8 minutes 24 seconds (28 mGy). COMPLICATIONS: None immediate. TECHNIQUE: Informed written consent was obtained from the patient after a thorough discussion of the procedural risks, benefits and alternatives. All questions were addressed. Maximal Sterile Barrier Technique was utilized including caps, mask, sterile gowns, sterile gloves, sterile drape, hand hygiene and skin antiseptic. A timeout was performed prior to the initiation of the procedure. The right chest was interrogated with ultrasound. There is only trace pleural fluid. No safe window from a lateral approach for thoracentesis. Attention was turned to the right groin. The right groin was interrogated with ultrasound. The common femoral vein is patent. There is no evidence of thrombus. Image was obtained and stored for the medical record. Using real-time sonographic guidance, the vessels punctured with a 21 gauge micropuncture needle. Using standard technique, the initial micro wire was exchanged through a transitional 5 [place]French micro sheath for a Bentson wire. A 6 French vascular sheath was then advanced over the Bentson wire and into the common femoral vein. An angled catheter was advanced over the Bentson wire and into the right heart. The Bentson wire was exchanged for a Glidewire which was then navigated into the main pulmonary artery. The main pulmonary arteriogram was performed. This confirms a large filling defect within the right main pulmonary artery consistent with acute pulmonary embolus. There is significantly decreased perfusion to the right lung. The catheter was next navigated into the right main pulmonary artery. Pressures were obtained. The right pulmonary  arterial pressure is 42/20 (mean 30). A right pulmonary arteriogram was performed. Again, there is a large filling defect in the right  main pulmonary artery extending into the upper lobe branches. There is significantly decreased perfusion in the right lung. The angled catheter was successfully navigated through the thrombus in into a branch of the right lower lobe pulmonary artery. The glidewire was exchanged for a Rosen wire. A 12 cm infusion length EKOS catheter was advanced over the wire and positioned across the thrombus. Pulmonary arterial thrombolysis was then initiated at a rate of 1 milligram/hour. The catheters were secured in place and a sterile bandage applied. The patient tolerated the procedure well and was returned to the intensive care unit. FINDINGS: Insufficient right-sided pleural fluid for thoracentesis. Right main pulmonary arterial pressure 42/20 (30) mm Hg Large volume thrombus within the right main pulmonary artery extending into right upper and lower lobar branches with significantly decreased perfusion to the right lung. IMPRESSION: 1. Insufficient right-sided pleural fluid for thoracentesis. Thoracentesis was not performed. 2. Pulmonary arterial hypertension with a right main pulmonary arterial pressure of 42/20 (30) mm Hg. 3. Initiation of right-sided pulmonary arterial thrombolysis at a rate of 1 milligram/hour which will continue for a total dose of 24 mg. Signed, Sterling Big, MD [facility] Radiology Electronically Signed   By: Malachy Moan M.D.   On: 02/11/2016 10:09   Ir Rande Lawman F/u Eval Art/ven Final Day (ms)  02/11/2016  INDICATION: 50 year old female with alcoholic pancreatitis, acute respiratory failure secondary to ARDS. Hospital course complicated fine large acute right pulmonary embolus with right heart strain. She is now status post right PE lysis with the EKOS infusion catheter. EXAM: IR THROMB F/U EVAL ART/VEN FINAL DAY MEDICATIONS: None. ANESTHESIA/SEDATION: None. The patient's level of consciousness and vital signs were monitored  continuously by radiology nursing throughout the procedure under my direct supervision. CONTRAST:  None. FLUOROSCOPY TIME:  Fluoroscopy Time: 6 seconds (0.2 mGy). COMPLICATIONS: None immediate. PROCEDURE: Informed consent was obtained from the patient following explanation of the procedure, risks, benefits and alternatives. The patient understands, agrees and consents for the procedure. All questions were addressed. A time out was performed prior to the initiation of the procedure. Maximal barrier sterile technique utilized including caps, mask, sterile gowns, sterile gloves, large sterile drape, hand hygiene, and Betadine prep. Under sterile conditions, the existing right pulmonary artery EKOS infusion wire was removed. PA pressure measurements were obtained through the infusion catheter. Patient is now status post 12 hour protocol for right pulmonary embolus EKOS lysis. Post lysis right PA pressure:  30/16 (22) IMPRESSION: Successful completion of the right PE lysis with the EKOS infusion catheter. Post lysis right PA pressure 30/16 (22) Electronically Signed   By: Judie Petit.  Shick M.D.   On: 02/11/2016 14:59    Labs:  CBC:  Recent Labs  01/26/2016 2305 02/10/16 0038 02/11/16 0335 02/12/16 0613  WBC 13.3* 17.2* 18.7* 18.4*  HGB 9.4* 9.4* 9.0* 10.3*  HCT 28.9* 29.8* 28.8* 32.6*  PLT 123* 166 231 233    COAGS:  Recent Labs  02/01/2016 2305 02/12/16 0148  INR 1.34 2.49*  APTT 38*  --     BMP:  Recent Labs  02/10/16 0038 02/11/16 0335 02/11/16 1357 02/12/16 0148  NA 138 137 136 138  K 4.0 3.8 3.4* 2.4*  CL 105 105 106 94*  CO2 23 18* 21* 32  GLUCOSE 130* 231* 196* 330*  BUN 6 7 7 7   CALCIUM 8.1*  7.6* 7.4* 7.4*  CREATININE 0.55 0.59 0.54 0.64  GFRNONAA >60 >60 >60 >60  GFRAA >60 >60 >60 >60    LIVER FUNCTION TESTS:  Recent Labs  01/31/2016 2305 02/10/16 0038 02/11/16 0335 02/12/16 0148  BILITOT 1.1 0.5 0.7 0.8  AST 76* 67* 118* 129*  ALT 38  ALKPHOS 81 87 96 105    PROT 4.8* 4.5* 4.2* 5.2*  ALBUMIN 1.5* 1.6* 1.4* 1.6*    Assessment and Plan:  Rt central PE Thrombolysis 3/28-29 in IR Plan per CCM  Electronically Signed: Tiahna Cure A 02/12/2016, 11:38 AM   I spent a total of 15 Minutes at the the patient's bedside AND on the patient's hospital floor or unit, greater than 50% of which was counseling/coordinating care for PE lysis

## 2016-02-12 NOTE — Progress Notes (Signed)
Inpatient Diabetes Program Recommendations  AACE/ADA: New Consensus Statement on Inpatient Glycemic Control (2015)  Target Ranges:  Prepandial:   less than 140 mg/dL      Peak postprandial:   less than 180 mg/dL (1-2 hours)      Critically ill patients:  140 - 180 mg/dL   Review of Glycemic Control CBGs 200-300s Inpatient Diabetes Program Recommendations: Consider adding basal insulin or use ICU Hyperglycemia Protocol. Thank you  Piedad ClimesGina Jozelynn Danielson BSN, RN,CDE Inpatient Diabetes Coordinator [phone] (team pager)

## 2016-02-12 NOTE — Progress Notes (Signed)
Patient has been having continuous desaturation issues. Increased FiO2 to 100% and performed a recruitment maneuver, and patient's SpO2 increased from 80% to 90%. RT will continue to monitor.

## 2016-02-12 NOTE — Progress Notes (Signed)
CRITICAL VALUE ALERT  Critical value received:  K 2.0  Date of notification:  02/12/16  Time of notification:  1305  Critical value read back:yes  Nurse who received alert:  Irven CoeLauren Charley Miske RN  MD notified (1st page):  Tyson AliasFeinstein   Time of first page:  At bedside  MD notified (2nd page):  Time of second page:  Responding MD:  Tyson AliasFeinstein  Time MD responded:  At bedside

## 2016-02-13 ENCOUNTER — Inpatient Hospital Stay (HOSPITAL_COMMUNITY): Payer: BLUE CROSS/BLUE SHIELD

## 2016-02-13 ENCOUNTER — Encounter (HOSPITAL_COMMUNITY): Payer: BLUE CROSS/BLUE SHIELD

## 2016-02-13 DIAGNOSIS — I2699 Other pulmonary embolism without acute cor pulmonale: Secondary | ICD-10-CM

## 2016-02-13 DIAGNOSIS — Z4659 Encounter for fitting and adjustment of other gastrointestinal appliance and device: Secondary | ICD-10-CM | POA: Insufficient documentation

## 2016-02-13 DIAGNOSIS — Z87898 Personal history of other specified conditions: Secondary | ICD-10-CM

## 2016-02-13 DIAGNOSIS — R600 Localized edema: Secondary | ICD-10-CM

## 2016-02-13 DIAGNOSIS — R609 Edema, unspecified: Secondary | ICD-10-CM | POA: Insufficient documentation

## 2016-02-13 DIAGNOSIS — Z9911 Dependence on respirator [ventilator] status: Secondary | ICD-10-CM | POA: Insufficient documentation

## 2016-02-13 LAB — POCT I-STAT 3, ART BLOOD GAS (G3+)
ACID-BASE EXCESS: 20 mmol/L — AB (ref 0.0–2.0)
BICARBONATE: 49 meq/L — AB (ref 20.0–24.0)
O2 SAT: 99 %
pCO2 arterial: 81.6 mmHg (ref 35.0–45.0)
pH, Arterial: 7.386 (ref 7.350–7.450)
pO2, Arterial: 134 mmHg — ABNORMAL HIGH (ref 80.0–100.0)

## 2016-02-13 LAB — BASIC METABOLIC PANEL
ANION GAP: 10 (ref 5–15)
Anion gap: 10 (ref 5–15)
Anion gap: 11 (ref 5–15)
Anion gap: 10 (ref 5–15)
BUN: 7 mg/dL (ref 6–20)
BUN: 8 mg/dL (ref 6–20)
BUN: 8 mg/dL (ref 6–20)
BUN: 9 mg/dL (ref 6–20)
CALCIUM: 7.4 mg/dL — AB (ref 8.9–10.3)
CALCIUM: 7.3 mg/dL — AB (ref 8.9–10.3)
CHLORIDE: 92 mmol/L — AB (ref 101–111)
CO2: 38 mmol/L — ABNORMAL HIGH (ref 22–32)
CO2: 43 mmol/L — ABNORMAL HIGH (ref 22–32)
CO2: 44 mmol/L — ABNORMAL HIGH (ref 22–32)
CO2: 47 mmol/L — ABNORMAL HIGH (ref 22–32)
CREATININE: 0.47 mg/dL (ref 0.44–1.00)
CREATININE: 0.51 mg/dL (ref 0.44–1.00)
CREATININE: 0.52 mg/dL (ref 0.44–1.00)
Calcium: 7.3 mg/dL — ABNORMAL LOW (ref 8.9–10.3)
Calcium: 7.4 mg/dL — ABNORMAL LOW (ref 8.9–10.3)
Chloride: 90 mmol/L — ABNORMAL LOW (ref 101–111)
Chloride: 89 mmol/L — ABNORMAL LOW (ref 101–111)
Chloride: 87 mmol/L — ABNORMAL LOW (ref 101–111)
Creatinine, Ser: 0.5 mg/dL (ref 0.44–1.00)
GFR calc Af Amer: >60 mL/min (ref >60)
GFR calc Af Amer: >60 mL/min (ref >60)
GFR calc Af Amer: >60 mL/min (ref >60)
GLUCOSE: 240 mg/dL — AB (ref 65–99)
GLUCOSE: 362 mg/dL — AB (ref 65–99)
GLUCOSE: 366 mg/dL — AB (ref 65–99)
Glucose, Bld: 321 mg/dL — ABNORMAL HIGH (ref 65–99)
POTASSIUM: 3.3 mmol/L — AB (ref 3.5–5.1)
POTASSIUM: 3.7 mmol/L (ref 3.5–5.1)
Potassium: 3.1 mmol/L — ABNORMAL LOW (ref 3.5–5.1)
Potassium: 2.8 mmol/L — ABNORMAL LOW (ref 3.5–5.1)
SODIUM: 144 mmol/L (ref 135–145)
SODIUM: 140 mmol/L (ref 135–145)
Sodium: 144 mmol/L (ref 135–145)
Sodium: 143 mmol/L (ref 135–145)

## 2016-02-13 LAB — MAGNESIUM
MAGNESIUM: 1.9 mg/dL (ref 1.7–2.4)
MAGNESIUM: 2.4 mg/dL (ref 1.7–2.4)
MAGNESIUM: 2.2 mg/dL (ref 1.7–2.4)

## 2016-02-13 LAB — COMPREHENSIVE METABOLIC PANEL
ALBUMIN: 1.7 g/dL — AB (ref 3.5–5.0)
ALT: 27 U/L (ref 14–54)
ANION GAP: 10 (ref 5–15)
AST: 60 U/L — AB (ref 15–41)
Alkaline Phosphatase: 95 U/L (ref 38–126)
BUN: 8 mg/dL (ref 6–20)
CHLORIDE: 90 mmol/L — AB (ref 101–111)
CO2: 42 mmol/L — ABNORMAL HIGH (ref 22–32)
Calcium: 7.4 mg/dL — ABNORMAL LOW (ref 8.9–10.3)
Creatinine, Ser: 0.49 mg/dL (ref 0.44–1.00)
GFR calc Af Amer: >60 mL/min (ref >60)
Glucose, Bld: 266 mg/dL — ABNORMAL HIGH (ref 65–99)
POTASSIUM: 2.7 mmol/L — AB (ref 3.5–5.1)
Sodium: 142 mmol/L (ref 135–145)
TOTAL PROTEIN: 5 g/dL — AB (ref 6.5–8.1)
Total Bilirubin: 0.6 mg/dL (ref 0.3–1.2)

## 2016-02-13 LAB — HEPARIN LEVEL (UNFRACTIONATED): Heparin Unfractionated: 0.53 IU/mL (ref 0.30–0.70)

## 2016-02-13 LAB — CBC WITH DIFFERENTIAL/PLATELET
BASOS ABS: 0 10*3/uL (ref 0.0–0.1)
BASOS PCT: 0 %
EOS PCT: 0 %
Eosinophils Absolute: 0 10*3/uL (ref 0.0–0.7)
HCT: 30.4 % — ABNORMAL LOW (ref 36.0–46.0)
Hemoglobin: 9.4 g/dL — ABNORMAL LOW (ref 12.0–15.0)
Lymphocytes Relative: 4 %
Lymphs Abs: 0.9 10*3/uL (ref 0.7–4.0)
MCH: 30.9 pg (ref 26.0–34.0)
MCHC: 30.9 g/dL (ref 30.0–36.0)
MCV: 100 fL (ref 78.0–100.0)
MONO ABS: 0.9 10*3/uL (ref 0.1–1.0)
MONOS PCT: 4 %
Neutro Abs: 22.3 10*3/uL — ABNORMAL HIGH (ref 1.7–7.7)
Neutrophils Relative %: 92 %
PLATELETS: 228 10*3/uL (ref 150–400)
RBC: 3.04 MIL/uL — ABNORMAL LOW (ref 3.87–5.11)
RDW: 16.7 % — AB (ref 11.5–15.5)
WBC: 24.1 10*3/uL — ABNORMAL HIGH (ref 4.0–10.5)

## 2016-02-13 LAB — GLUCOSE, CAPILLARY
Glucose-Capillary: 230 mg/dL — ABNORMAL HIGH (ref 65–99)
Glucose-Capillary: 272 mg/dL — ABNORMAL HIGH (ref 65–99)
Glucose-Capillary: 311 mg/dL — ABNORMAL HIGH (ref 65–99)
Glucose-Capillary: 325 mg/dL — ABNORMAL HIGH (ref 65–99)
Glucose-Capillary: 371 mg/dL — ABNORMAL HIGH (ref 65–99)

## 2016-02-13 LAB — BLOOD GAS, ARTERIAL
Acid-Base Excess: 17.8 mmol/L — ABNORMAL HIGH (ref 0.0–2.0)
Bicarbonate: 43.1 mEq/L — ABNORMAL HIGH (ref 20.0–24.0)
DRAWN BY: 437071
FIO2: 1
O2 SAT: 92.8 %
PATIENT TEMPERATURE: 98.3
PCO2 ART: 62 mmHg — AB (ref 35.0–45.0)
PEEP: 10 cmH2O
PO2 ART: 70.9 mmHg — AB (ref 80.0–100.0)
Pressure control: 10 cmH2O
RATE: 12 resp/min
TCO2: 45 mmol/L (ref 0–100)
pH, Arterial: 7.456 — ABNORMAL HIGH (ref 7.350–7.450)

## 2016-02-13 LAB — POCT I-STAT 3, VENOUS BLOOD GAS (G3P V)
ACID-BASE EXCESS: 16 mmol/L — AB (ref 0.0–2.0)
BICARBONATE: 45.4 meq/L — AB (ref 20.0–24.0)
O2 SAT: 76 %
PO2 VEN: 47 mmHg — AB (ref 31.0–45.0)
TCO2: 48 mmol/L (ref 0–100)
pCO2, Ven: 87.6 mmHg (ref 45.0–50.0)
pH, Ven: 7.322 — ABNORMAL HIGH (ref 7.250–7.300)

## 2016-02-13 LAB — C DIFFICILE QUICK SCREEN W PCR REFLEX
C DIFFICLE (CDIFF) ANTIGEN: NEGATIVE
C Diff interpretation: NEGATIVE
C Diff toxin: NEGATIVE

## 2016-02-13 LAB — PHOSPHORUS
PHOSPHORUS: 2.5 mg/dL (ref 2.5–4.6)
Phosphorus: 2.6 mg/dL (ref 2.5–4.6)
Phosphorus: 2.8 mg/dL (ref 2.5–4.6)

## 2016-02-13 LAB — PROTIME-INR
INR: 1.42 (ref 0.00–1.49)
PROTHROMBIN TIME: 17.5 s — AB (ref 11.6–15.2)

## 2016-02-13 LAB — APTT: APTT: 129 s — AB (ref 24–37)

## 2016-02-13 MED ORDER — INSULIN GLARGINE 100 UNIT/ML ~~LOC~~ SOLN
25.0000 [IU] | Freq: Every day | SUBCUTANEOUS | Status: DC
  Administered 2016-02-14 – 2016-02-15 (×2): 25 [IU] via SUBCUTANEOUS
  Filled 2016-02-13 (×2): qty 0.25

## 2016-02-13 MED ORDER — VITAL AF 1.2 CAL PO LIQD
1000.0000 mL | ORAL | Status: DC
  Administered 2016-02-13 – 2016-02-15 (×3): 1000 mL

## 2016-02-13 MED ORDER — VECURONIUM BROMIDE 10 MG IV SOLR
5.0000 mg | Freq: Once | INTRAVENOUS | Status: AC
  Administered 2016-02-13: 5 mg via INTRAVENOUS

## 2016-02-13 MED ORDER — POTASSIUM CHLORIDE 20 MEQ/15ML (10%) PO SOLN: 40.0000 meq | Freq: Once | ORAL | Status: AC

## 2016-02-13 MED ORDER — POTASSIUM CHLORIDE 10 MEQ/50ML IV SOLN
10.0000 meq | INTRAVENOUS | Status: AC
  Administered 2016-02-13 – 2016-02-14 (×6): 10 meq via INTRAVENOUS
  Filled 2016-02-13 (×6): qty 50

## 2016-02-13 MED ORDER — POTASSIUM CHLORIDE 20 MEQ/15ML (10%) PO SOLN
40.0000 meq | ORAL | Status: AC
  Administered 2016-02-13 (×2): 40 meq
  Filled 2016-02-13 (×2): qty 30

## 2016-02-13 MED ORDER — POTASSIUM CHLORIDE 10 MEQ/50ML IV SOLN
10.0000 meq | INTRAVENOUS | Status: AC
  Administered 2016-02-13 (×2): 10 meq via INTRAVENOUS
  Filled 2016-02-13 (×2): qty 50

## 2016-02-13 MED ORDER — VECURONIUM BROMIDE 10 MG IV SOLR
INTRAVENOUS | Status: AC
  Filled 2016-02-13: qty 10

## 2016-02-13 MED ORDER — FUROSEMIDE 10 MG/ML IJ SOLN
5.0000 mg/h | INTRAVENOUS | Status: DC
  Administered 2016-02-13: 5 mg/h via INTRAVENOUS
  Filled 2016-02-13 (×4): qty 25

## 2016-02-13 MED ORDER — POTASSIUM CHLORIDE 20 MEQ/15ML (10%) PO SOLN
ORAL | Status: AC
Start: 2016-02-13 — End: 2016-02-13
  Filled 2016-02-13: qty 30

## 2016-02-13 MED ORDER — POTASSIUM CHLORIDE 20 MEQ/15ML (10%) PO SOLN
40.0000 meq | Freq: Once | ORAL | Status: AC
  Administered 2016-02-13: 40 meq via ORAL

## 2016-02-13 NOTE — Progress Notes (Signed)
Inpatient Diabetes Program Recommendations  AACE/ADA: New Consensus Statement on Inpatient Glycemic Control (2015)  Target Ranges:  Prepandial:   less than 140 mg/dL      Peak postprandial:   less than 180 mg/dL (1-2 hours)      Critically ill patients:  140 - 180 mg/dL  Results for [name], Abigail Baker (MRN [id number]) as of 02/13/2016 09:35  Ref. Range 02/12/2016 08:01 02/12/2016 11:33 02/12/2016 15:29 02/12/2016 20:30 02/13/2016 00:11 02/13/2016 03:22 02/13/2016 08:26  Glucose-Capillary Latest Ref Range: 65-99 mg/dL 782281 (H) 956379 (H) 213356 (H) 360 (H) 371 (H) 272 (H) 230 (H)   Review of Glycemic Control  Current orders for Inpatient glycemic control: Lantus 15 units daily, Novolog 0-15 units Q4H  Inpatient Diabetes Program Recommendations: Correction (SSI): Glucose has ranged from 230-379 mg/dl over the past 24 hours. Please discontinue current glycemic order set and use the ICU Glycemic Control order set to improve inpatient glycemic control.  Thanks, Orlando PennerMarie Muranda Coye, RN, MSN, CDE Diabetes Coordinator Inpatient Diabetes Program [phone] (Team Pager from 8am to 5pm) [phone] ([facility] office) [phone] [facility] office) [phone] [facility] office)

## 2016-02-13 NOTE — Procedures (Signed)
Central Venous Catheter Insertion Procedure Note Abigail Baker [id number] 04/27/1966  Procedure: Insertion of Central Venous Catheter Indications: Assessment of intravascular volume and Drug and/or fluid administration  Procedure Details Consent: Risks of procedure as well as the alternatives and risks of each were explained to the (patient/caregiver).  Consent for procedure obtained. Time Out: Verified patient identification, verified procedure, site/side was marked, verified correct patient position, special equipment/implants available, medications/allergies/relevent history reviewed, required imaging and test results available.  Performed  Maximum sterile technique was used including antiseptics, cap, gloves, gown, hand hygiene, mask and sheet. Skin prep: Chlorhexidine; local anesthetic administered A antimicrobial bonded/coated triple lumen catheter was placed in the left internal jugular vein using the Seldinger technique. Ultrasound guidance used.Yes.   Catheter placed to 20 cm. Blood aspirated via all 3 ports and then flushed x 3. Line sutured x 2 and dressing applied.  Evaluation Blood flow good Complications: No apparent complications Patient did tolerate procedure well. Chest X-ray ordered to verify placement.  CXR: pending.  Brett CanalesSteve Minor ACNP [facility] Pager [phone] till 3 pm If no answer page [phone] 02/13/2016, 1:09 PM    Required supervised US Need to dc rt ij with clot above  Mcarthur Rossettianiel J. Tyson AliasFeinstein, MD, FACP Pgr: [phone] [facility]

## 2016-02-13 NOTE — Progress Notes (Signed)
Nutrition Follow-up  DOCUMENTATION CODES:   Obesity unspecified  INTERVENTION:   -Provide Vital AF 1.2 @ goal rate of 40 ml/h (960 ml /day) and 30 ml Prostat BID, provides 1352 kcals , 102 grams of protein, 779 ml of free water  NUTRITION DIAGNOSIS:   Inadequate oral intake related to inability to eat as evidenced by NPO status.  -Ongoing  GOAL:   Provide needs based on ASPEN/SCCM guidelines  -Meeting   MONITOR:   TF tolerance, Vent status, Labs, Weight trends, Skin, I & O's  ASSESSMENT:   50 yo F with h/o DM, Etoh abuse, who presented to [facility] on 01/29/2016 after several weeks of abdominal pain, n/v. She was found to have most likely EtOH pancreatitis, she developed ARDS and reportedly b/l PNA for which she was treated with abx. She also developed a pancreatic pseudocyst. [facility] for 24/7 CCM and advanced ventilator management given bouts of hypoxemia despite high PEEP/FiO2  MV: 6.6 L/min Temp (24hrs), Avg:98.3 F (36.8 C), Min:97.6 F (36.4 C), Max:98.6 F (37 C) Propofol: none   Medications reviewed; multivitamin liquid 15 mL, thiamine (B-1) injection 100 mg, folic acid injection 1 mg Labs reviewed; potassium (3.1) low, CBG 230-371  Diet Order:     Skin:  Wound (see comment) (DTI to lip)  Last BM:  N/A  Height:   Ht Readings from Last 1 Encounters:  02/09/16 5\' 1"  (1.549 m)    Weight:   Wt Readings from Last 1 Encounters:  02/13/16 141 lb 1.5 oz (64 kg)    Ideal Body Weight:  45.4 kg  BMI:  Body mass index is 26.67 kg/(m^2).  Estimated Nutritional Needs:   Kcal:  1338  Protein:  >/= 95 grams  Fluid:  1.3 L  EDUCATION NEEDS:   No education needs identified at this time  SwedenBrittany Ismaeel Arvelo, Dietetic Intern Pager: [phone]

## 2016-02-13 NOTE — Progress Notes (Signed)
Referring Physician(s): Dr Rory Percy  Supervising Physician: Ruel Favors  Chief Complaint:  PE lysis 3/28-29   Subjective:  tx from [place] with continued hypoxia Found to have B PE with R heart strain Thrombolysis 3/28-29 Still on vent Upper extr edema Now with new venous thrombus upper extremities and IJ -- Vasc tech performing [place] now   Allergies: Review of patient's allergies indicates no known allergies.  Medications: Prior to Admission medications   Medication Sig Start Date End Date Taking? Authorizing Provider  acetaminophen (TYLENOL) 325 MG tablet Take 650 mg by mouth every 6 (six) hours as needed for mild pain or fever (fever above 100.4).   Yes Historical Provider, MD  acetaminophen (TYLENOL) 650 MG suppository Place 650 mg rectally every 6 (six) hours as needed for mild pain or fever (above 100.4).   Yes Historical Provider, MD  alum & mag hydroxide-simeth (MAALOX/MYLANTA) 200-200-20 MG/5ML suspension Take 30 mLs by mouth every 4 (four) hours as needed for indigestion or heartburn.   Yes Historical Provider, MD  dicyclomine (BENTYL) 20 MG tablet Take 20 mg by mouth every 6 (six) hours. 01/19/16  Yes Historical Provider, MD  docusate sodium (COLACE) 100 MG capsule Take 100 mg by mouth daily as needed for mild constipation.   Yes Historical Provider, MD  enalapril (VASOTEC) 5 MG tablet Take 2.5 mg by mouth every morning. 01/20/16  Yes Historical Provider, MD  enoxaparin (LOVENOX) 40 MG/0.4ML injection Inject 40 mg into the skin daily.   Yes Historical Provider, MD  GLUCERNA (GLUCERNA) LIQD Give 1,000 mLs by tube daily. Start OGT feedin at 103mL/hr, do not titrate   Yes Historical Provider, MD  guaiFENesin-dextromethorphan (ROBITUSSIN DM) 100-10 MG/5ML syrup Take 10 mLs by mouth every 6 (six) hours as needed for cough.   Yes Historical Provider, MD  insulin regular (NOVOLIN R,HUMULIN R) 100 units/mL injection Inject into the skin every 6 (six) hours. Sliding  Scale   Yes Historical Provider, MD  levothyroxine (SYNTHROID, LEVOTHROID) 125 MCG tablet Take 125 mcg by mouth daily before breakfast.   Yes Historical Provider, MD  lipase/protease/amylase (CREON) 12000 units CPEP capsule Take 24,000 Units by mouth 3 (three) times daily with meals.   Yes Historical Provider, MD  LORazepam (ATIVAN) 2 MG/ML injection Inject 2 mg into the vein every 4 (four) hours as needed (withdrawal score 10-12).   Yes Historical Provider, MD  magnesium hydroxide (MILK OF MAGNESIA) 400 MG/5ML suspension Take 30 mLs by mouth daily as needed for mild constipation.   Yes Historical Provider, MD  ondansetron (ZOFRAN) 40 MG/20ML SOLN injection Inject 4 mg into the vein every 6 (six) hours as needed for nausea or vomiting.   Yes Historical Provider, MD  pantoprazole (PROTONIX) 40 MG injection Inject 40 mg into the vein daily.   Yes Historical Provider, MD  Prenatal Vit-Fe Fumarate-FA (PRENATAL MULTIVITAMIN) TABS tablet Take 1 tablet by mouth daily at 12 noon.   Yes Historical Provider, MD  promethazine (PHENERGAN) 25 MG/ML injection Inject 12.5 mg into the vein every 6 (six) hours as needed for nausea or vomiting.   Yes Historical Provider, MD  propranolol (INDERAL) 10 MG tablet Take 10 mg by mouth 2 (two) times daily.   Yes Historical Provider, MD  ranitidine (ZANTAC) 150 MG tablet Take 150 mg by mouth 2 (two) times daily. 01/28/16  Yes Historical Provider, MD  sertraline (ZOLOFT) 100 MG tablet Take 200 mg by mouth daily. 01/20/16  Yes Historical Provider, MD  temazepam (RESTORIL) 15 MG  capsule Take 15 mg by mouth at bedtime as needed for sleep (insomnia).   Yes Historical Provider, MD  thiamine (VITAMIN B-1) 100 MG tablet Take 100 mg by mouth daily.   Yes Historical Provider, MD     Vital Signs: BP 111/63 mmHg  Pulse 92  Temp(Src) 97.6 F (36.4 C) (Oral)  Resp 21  Ht 5\' 1"  (1.549 m)  Wt 141 lb 1.5 oz (64 kg)  BMI 26.67 kg/m2  SpO2 90%  LMP 01/27/2016  Physical Exam    Pulmonary/Chest:  On vent   Neurological:  No response  Nursing note and vitals reviewed.  [place] in progress  Imaging: Ct Angio Chest Pe W/cm &/or Wo Cm  02/09/2016  CLINICAL DATA:  Acute respiratory failure with hypoxia. EXAM: CT ANGIOGRAPHY CHEST WITH CONTRAST TECHNIQUE: Multidetector CT imaging of the chest was performed using the standard protocol during bolus administration of intravenous contrast. Multiplanar CT image reconstructions and MIPs were obtained to evaluate the vascular anatomy. CONTRAST:  80 mL of Isovue 370 intravenously. COMPARISON:  None. FINDINGS: No pneumothorax is noted. Endotracheal tube is in grossly good position. Moderate bilateral pleural effusions are noted. Diffuse bilateral lung opacities are noted concerning for pneumonia or edema. There is no evidence of thoracic aortic dissection or aneurysm. Small embolus is noted in lower lobe branch of left pulmonary artery. Large embolus is noted in the right pulmonary artery which extends into upper and lower lobe branches. Moderate anasarca is noted. RV/LV ratio of 1.1 is noted. Multiple old left rib fractures are noted. Review of the MIP images confirms the above findings. IMPRESSION: Diffuse bilateral lung opacities are noted concerning for edema or pneumonia. Moderate bilateral pleural effusions are noted. Moderate anasarca is noted. Small pulmonary embolus is noted peripherally in lower lobe branch of left pulmonary artery. Large central embolus is noted in the right pulmonary artery. Positive for acute PE with CT evidence of right heart strain (RV/LV Ratio = 1.1) consistent with at least submassive (intermediate risk) PE. The presence of right heart strain has been associated with an increased risk of morbidity and mortality. Please activate Code PE by paging [phone]. Critical Value/emergent results were called by telephone at the time of interpretation on 02/09/2016 at 5:05 pm to [facility] Ksor, patient's nurse, who verbally  acknowledged these results and will immediately contact the ordering physician. Electronically Signed   By: Lupita Raider, M.D.   On: 02/09/2016 17:06   Ir Angiogram Pulmonary Right Selective  02/11/2016  INDICATION: 50 year old female with alcoholic pancreatitis and sudden onset respiratory failure at an outside hospital. CT imaging confirms large volume right-sided pulmonary embolus with evidence right heart strain. Additionally, she has significant airspace disease bilaterally and bilateral layering pleural effusions. She presents to interventional radiology for potential right-sided thoracentesis if there is sufficient fluid as well as right-sided PE lysis. EXAM: IR INFUSION THROMBOL VENOUS INITIAL (MS); IR ULTRASOUND GUIDANCE VASC ACCESS RIGHT; RIGHT PULMONARY ARTERIOGRAPHY; ADDITIONAL ARTERIOGRAPHY COMPARISON:  CT chest 02/09/2016 MEDICATIONS: None. ANESTHESIA/SEDATION: Versed 1 mg IV; Fentanyl 50 mcg IV Moderate Sedation Time:  30 The patient was continuously monitored during the procedure by the interventional radiology nurse under my direct supervision. FLUOROSCOPY TIME:  Fluoroscopy Time: 8 minutes 24 seconds (28 mGy). COMPLICATIONS: None immediate. TECHNIQUE: Informed written consent was obtained from the patient after a thorough discussion of the procedural risks, benefits and alternatives. All questions were addressed. Maximal Sterile Barrier Technique was utilized including caps, mask, sterile gowns, sterile gloves, sterile drape, hand hygiene and skin  antiseptic. A timeout was performed prior to the initiation of the procedure. The right chest was interrogated with ultrasound. There is only trace pleural fluid. No safe window from a lateral approach for thoracentesis. Attention was turned to the right groin. The right groin was interrogated with ultrasound. The common femoral vein is patent. There is no evidence of thrombus. Image was obtained and stored for the medical record. Using real-time  sonographic guidance, the vessels punctured with a 21 gauge micropuncture needle. Using standard technique, the initial micro wire was exchanged through a transitional 5 [place] micro sheath for a Bentson wire. A 6 French vascular sheath was then advanced over the Bentson wire and into the common femoral vein. An angled catheter was advanced over the Bentson wire and into the right heart. The Bentson wire was exchanged for a Glidewire which was then navigated into the main pulmonary artery. The main pulmonary arteriogram was performed. This confirms a large filling defect within the right main pulmonary artery consistent with acute pulmonary embolus. There is significantly decreased perfusion to the right lung. The catheter was next navigated into the right main pulmonary artery. Pressures were obtained. The right pulmonary arterial pressure is 42/20 (mean 30). A right pulmonary arteriogram was performed. Again, there is a large filling defect in the right main pulmonary artery extending into the upper lobe branches. There is significantly decreased perfusion in the right lung. The angled catheter was successfully navigated through the thrombus in into a branch of the right lower lobe pulmonary artery. The glidewire was exchanged for a Rosen wire. A 12 cm infusion length EKOS catheter was advanced over the wire and positioned across the thrombus. Pulmonary arterial thrombolysis was then initiated at a rate of 1 milligram/hour. The catheters were secured in place and a sterile bandage applied. The patient tolerated the procedure well and was returned to the intensive care unit. FINDINGS: Insufficient right-sided pleural fluid for thoracentesis. Right main pulmonary arterial pressure 42/20 (30) mm Hg Large volume thrombus within the right main pulmonary artery extending into right upper and lower lobar branches with significantly decreased perfusion to the right lung. IMPRESSION: 1. Insufficient right-sided pleural  fluid for thoracentesis. Thoracentesis was not performed. 2. Pulmonary arterial hypertension with a right main pulmonary arterial pressure of 42/20 (30) mm Hg. 3. Initiation of right-sided pulmonary arterial thrombolysis at a rate of 1 milligram/hour which will continue for a total dose of 24 mg. Signed, Sterling Big, MD [facility] Electronically Signed   By: Malachy Moan M.D.   On: 02/11/2016 10:09   Ir Angiogram Selective Each Additional Vessel  02/11/2016  INDICATION: 50 year old female with alcoholic pancreatitis and sudden onset respiratory failure at an outside hospital. CT imaging confirms large volume right-sided pulmonary embolus with evidence right heart strain. Additionally, she has significant airspace disease bilaterally and bilateral layering pleural effusions. She presents to interventional radiology for potential right-sided thoracentesis if there is sufficient fluid as well as right-sided PE lysis. EXAM: IR INFUSION THROMBOL VENOUS INITIAL (MS); IR ULTRASOUND GUIDANCE VASC ACCESS RIGHT; RIGHT PULMONARY ARTERIOGRAPHY; ADDITIONAL ARTERIOGRAPHY COMPARISON:  CT chest 02/09/2016 MEDICATIONS: None. ANESTHESIA/SEDATION: Versed 1 mg IV; Fentanyl 50 mcg IV Moderate Sedation Time:  30 The patient was continuously monitored during the procedure by the interventional radiology nurse under my direct supervision. FLUOROSCOPY TIME:  Fluoroscopy Time: 8 minutes 24 seconds (28 mGy). COMPLICATIONS: None immediate. TECHNIQUE: Informed written consent was obtained from the patient after a thorough discussion of the procedural risks,  benefits and alternatives. All questions were addressed. Maximal Sterile Barrier Technique was utilized including caps, mask, sterile gowns, sterile gloves, sterile drape, hand hygiene and skin antiseptic. A timeout was performed prior to the initiation of the procedure. The right chest was interrogated with  ultrasound. There is only trace pleural fluid. No safe window from a lateral approach for thoracentesis. Attention was turned to the right groin. The right groin was interrogated with ultrasound. The common femoral vein is patent. There is no evidence of thrombus. Image was obtained and stored for the medical record. Using real-time sonographic guidance, the vessels punctured with a 21 gauge micropuncture needle. Using standard technique, the initial micro wire was exchanged through a transitional 5 [place] micro sheath for a Bentson wire. A 6 French vascular sheath was then advanced over the Bentson wire and into the common femoral vein. An angled catheter was advanced over the Bentson wire and into the right heart. The Bentson wire was exchanged for a Glidewire which was then navigated into the main pulmonary artery. The main pulmonary arteriogram was performed. This confirms a large filling defect within the right main pulmonary artery consistent with acute pulmonary embolus. There is significantly decreased perfusion to the right lung. The catheter was next navigated into the right main pulmonary artery. Pressures were obtained. The right pulmonary arterial pressure is 42/20 (mean 30). A right pulmonary arteriogram was performed. Again, there is a large filling defect in the right main pulmonary artery extending into the upper lobe branches. There is significantly decreased perfusion in the right lung. The angled catheter was successfully navigated through the thrombus in into a branch of the right lower lobe pulmonary artery. The glidewire was exchanged for a Rosen wire. A 12 cm infusion length EKOS catheter was advanced over the wire and positioned across the thrombus. Pulmonary arterial thrombolysis was then initiated at a rate of 1 milligram/hour. The catheters were secured in place and a sterile bandage applied. The patient tolerated the procedure well and was returned to the intensive care unit. FINDINGS:  Insufficient right-sided pleural fluid for thoracentesis. Right main pulmonary arterial pressure 42/20 (30) mm Hg Large volume thrombus within the right main pulmonary artery extending into right upper and lower lobar branches with significantly decreased perfusion to the right lung. IMPRESSION: 1. Insufficient right-sided pleural fluid for thoracentesis. Thoracentesis was not performed. 2. Pulmonary arterial hypertension with a right main pulmonary arterial pressure of 42/20 (30) mm Hg. 3. Initiation of right-sided pulmonary arterial thrombolysis at a rate of 1 milligram/hour which will continue for a total dose of 24 mg. Signed, Sterling Big, MD [facility] Electronically Signed   By: Malachy Moan M.D.   On: 02/11/2016 10:09   Ir US Guide Vasc Access Right  02/11/2016  INDICATION: 50 year old female with alcoholic pancreatitis and sudden onset respiratory failure at an outside hospital. CT imaging confirms large volume right-sided pulmonary embolus with evidence right heart strain. Additionally, she has significant airspace disease bilaterally and bilateral layering pleural effusions. She presents to interventional radiology for potential right-sided thoracentesis if there is sufficient fluid as well as right-sided PE lysis. EXAM: IR INFUSION THROMBOL VENOUS INITIAL (MS); IR ULTRASOUND GUIDANCE VASC ACCESS RIGHT; RIGHT PULMONARY ARTERIOGRAPHY; ADDITIONAL ARTERIOGRAPHY COMPARISON:  CT chest 02/09/2016 MEDICATIONS: None. ANESTHESIA/SEDATION: Versed 1 mg IV; Fentanyl 50 mcg IV Moderate Sedation Time:  30 The patient was continuously monitored during the procedure by the interventional radiology nurse under my direct supervision. FLUOROSCOPY TIME:  Fluoroscopy Time:  8 minutes 24 seconds (28 mGy). COMPLICATIONS: None immediate. TECHNIQUE: Informed written consent was obtained from the patient after a thorough discussion of the procedural risks,  benefits and alternatives. All questions were addressed. Maximal Sterile Barrier Technique was utilized including caps, mask, sterile gowns, sterile gloves, sterile drape, hand hygiene and skin antiseptic. A timeout was performed prior to the initiation of the procedure. The right chest was interrogated with ultrasound. There is only trace pleural fluid. No safe window from a lateral approach for thoracentesis. Attention was turned to the right groin. The right groin was interrogated with ultrasound. The common femoral vein is patent. There is no evidence of thrombus. Image was obtained and stored for the medical record. Using real-time sonographic guidance, the vessels punctured with a 21 gauge micropuncture needle. Using standard technique, the initial micro wire was exchanged through a transitional 5 [place] micro sheath for a Bentson wire. A 6 French vascular sheath was then advanced over the Bentson wire and into the common femoral vein. An angled catheter was advanced over the Bentson wire and into the right heart. The Bentson wire was exchanged for a Glidewire which was then navigated into the main pulmonary artery. The main pulmonary arteriogram was performed. This confirms a large filling defect within the right main pulmonary artery consistent with acute pulmonary embolus. There is significantly decreased perfusion to the right lung. The catheter was next navigated into the right main pulmonary artery. Pressures were obtained. The right pulmonary arterial pressure is 42/20 (mean 30). A right pulmonary arteriogram was performed. Again, there is a large filling defect in the right main pulmonary artery extending into the upper lobe branches. There is significantly decreased perfusion in the right lung. The angled catheter was successfully navigated through the thrombus in into a branch of the right lower lobe pulmonary artery. The glidewire was exchanged for a Rosen wire. A 12 cm infusion length EKOS catheter  was advanced over the wire and positioned across the thrombus. Pulmonary arterial thrombolysis was then initiated at a rate of 1 milligram/hour. The catheters were secured in place and a sterile bandage applied. The patient tolerated the procedure well and was returned to the intensive care unit. FINDINGS: Insufficient right-sided pleural fluid for thoracentesis. Right main pulmonary arterial pressure 42/20 (30) mm Hg Large volume thrombus within the right main pulmonary artery extending into right upper and lower lobar branches with significantly decreased perfusion to the right lung. IMPRESSION: 1. Insufficient right-sided pleural fluid for thoracentesis. Thoracentesis was not performed. 2. Pulmonary arterial hypertension with a right main pulmonary arterial pressure of 42/20 (30) mm Hg. 3. Initiation of right-sided pulmonary arterial thrombolysis at a rate of 1 milligram/hour which will continue for a total dose of 24 mg. Signed, Sterling Big, MD [facility] Electronically Signed   By: Malachy Moan M.D.   On: 02/11/2016 10:09   Dg Chest Port 1 View  02/13/2016  CLINICAL DATA:  ARDS. EXAM: PORTABLE CHEST 1 VIEW COMPARISON:  02/12/2016 FINDINGS: Endotracheal tube, right IJ line, feeding tube in stable position. Heart size normal. Diffuse bilateral airspace disease unchanged. No pleural effusion or pneumothorax . IMPRESSION: 1. Lines and tubes in stable position. 2. Diffuse bilateral airspace disease.  No interim change. Electronically Signed   By: Maisie Fus  Register   On: 02/13/2016 07:17   Dg Chest Port 1 View  02/12/2016  CLINICAL DATA:  50 year old female with an ARDS. Subsequent encounter. EXAM: PORTABLE CHEST 1 VIEW COMPARISON:  02/11/2016. FINDINGS:  Endotracheal tube tip 2.2 cm above the carina. Right central line tip cavoatrial junction level. Feeding tube in place.  Tip not imaged on the current exam. Diffuse airspace disease with  bilateral pleural effusions minimally improved from prior exam. No gross pneumothorax. Heart size within normal limits. Infusion catheter removed. IMPRESSION: Endotracheal tube tip 2.2 cm above the carina. Right central line tip cavoatrial junction level. Diffuse airspace disease with bilateral pleural effusions minimally improved from prior exam. Infusion catheter removed. Electronically Signed   By: Lacy DuverneySteven  Olson M.D.   On: 02/12/2016 07:25   Dg Chest Port 1 View  02/11/2016  CLINICAL DATA:  Followup endotracheal tube positioning. Subsequent encounter. EXAM: PORTABLE CHEST 1 VIEW COMPARISON:  Chest radiograph and CTA of the chest performed 02/09/2016 FINDINGS: The patient's endotracheal tube is seen ending 2 cm above the carina. Enteric tubes are noted extending below the diaphragm. A right IJ line is noted ending about the proximal right atrium. Small bilateral pleural effusions are noted, more prominent than on the prior study, with bilateral airspace opacification, likely reflecting pulmonary edema. No pneumothorax is seen. The cardiomediastinal silhouette is borderline normal in size. No acute osseous abnormalities are identified. IMPRESSION: 1. Endotracheal tube noted ending 2 cm above the carina. 2. Small bilateral pleural effusions, more prominent than on the prior study, with bilateral airspace opacification, likely reflecting worsening pulmonary edema. Pneumonia could have a similar appearance. Electronically Signed   By: Roanna RaiderJeffery  Chang M.D.   On: 02/11/2016 06:59   Dg Chest Port 1 View  02/09/2016  CLINICAL DATA:  Acute respiratory failure with hypoxia. EXAM: PORTABLE CHEST 1 VIEW COMPARISON:  Chest x-ray dated 01/17/16. FINDINGS: Endotracheal tube remains well positioned with tip just above the level of the carina. Enteric tube passes below the diaphragm. Right IJ central line is well positioned with tip at the level of the lower SVC. Heart size is normal. Overall cardiomediastinal silhouette is  stable in size and configuration. The diffuse bilateral pulmonary edema pattern is stable, perhaps slightly improved aeration bilaterally. Dense opacities at each lung base are unchanged, most likely atelectasis and/or small effusions. IMPRESSION: Stable exam with diffuse bilateral pulmonary edema pattern versus ARDS, perhaps mildly improved aeration bilaterally. Persistent bibasilar atelectasis and/or small effusions. Tubes and lines remain well-positioned. A left lateral rib fracture was suspected on yesterday's exam, better seen on the previous chest x-ray. Electronically Signed   By: Bary RichardStan  Maynard M.D.   On: 02/09/2016 13:54   Dg Abd Portable 1v  02/10/2016  CLINICAL DATA:  Feeding tube placement. EXAM: PORTABLE ABDOMEN - 1 VIEW COMPARISON:  02/09/2016 FINDINGS: There is normal small bowel gas pattern. NG tube in place with tip in mid stomach. There is a NG feeding tube with tip in distal stomach or proximal duodenum. IMPRESSION: NG tube in place with tip in mid stomach. There is a NG feeding tube with tip in distal stomach or proximal duodenum. Electronically Signed   By: Natasha MeadLiviu  Pop M.D.   On: 02/10/2016 13:09   Dg Abd Portable 1v  02/09/2016  CLINICAL DATA:  Feeding tube placement EXAM: PORTABLE ABDOMEN - 1 VIEW COMPARISON:  Portable exam 1812 hours without priors for comparison FINDINGS: Tip of nasogastric tube projects over mid stomach. Tip of feeding tube projects over distal gastric antrum with redundant tubing within stomach. Retained contrast in colon. BILATERAL pulmonary infiltrates. Excreted contrast material within renal collecting systems. IMPRESSION: Tip of feeding tube projects over distal gastric antrum region. Diffuse BILATERAL pulmonary infiltrates. Electronically Signed  By: Ulyses Southward M.D.   On: 02/09/2016 19:07   Ct Portable Head W/o Cm  02/10/2016  CLINICAL DATA:  Acute encephalopathy EXAM: CT HEAD WITHOUT CONTRAST TECHNIQUE: Contiguous axial images were obtained from the base of  the skull through the vertex without intravenous contrast. COMPARISON:  None. FINDINGS: Bony calvarium is intact. Endotracheal tube and nasogastric catheter are noted. No findings to suggest acute hemorrhage are seen. No acute infarct is noted. IMPRESSION: No acute abnormality is noted at this time. Electronically Signed   By: Alcide Clever M.D.   On: 02/10/2016 11:29   Ir Infusion Thrombol Venous Initial (ms)  02/11/2016  INDICATION: 50 year old female with alcoholic pancreatitis and sudden onset respiratory failure at an outside hospital. CT imaging confirms large volume right-sided pulmonary embolus with evidence right heart strain. Additionally, she has significant airspace disease bilaterally and bilateral layering pleural effusions. She presents to interventional radiology for potential right-sided thoracentesis if there is sufficient fluid as well as right-sided PE lysis. EXAM: IR INFUSION THROMBOL VENOUS INITIAL (MS); IR ULTRASOUND GUIDANCE VASC ACCESS RIGHT; RIGHT PULMONARY ARTERIOGRAPHY; ADDITIONAL ARTERIOGRAPHY COMPARISON:  CT chest 02/09/2016 MEDICATIONS: None. ANESTHESIA/SEDATION: Versed 1 mg IV; Fentanyl 50 mcg IV Moderate Sedation Time:  30 The patient was continuously monitored during the procedure by the interventional radiology nurse under my direct supervision. FLUOROSCOPY TIME:  Fluoroscopy Time: 8 minutes 24 seconds (28 mGy). COMPLICATIONS: None immediate. TECHNIQUE: Informed written consent was obtained from the patient after a thorough discussion of the procedural risks, benefits and alternatives. All questions were addressed. Maximal Sterile Barrier Technique was utilized including caps, mask, sterile gowns, sterile gloves, sterile drape, hand hygiene and skin antiseptic. A timeout was performed prior to the initiation of the procedure. The right chest was interrogated with ultrasound. There is only trace pleural fluid. No safe window from a lateral approach for thoracentesis. Attention was  turned to the right groin. The right groin was interrogated with ultrasound. The common femoral vein is patent. There is no evidence of thrombus. Image was obtained and stored for the medical record. Using real-time sonographic guidance, the vessels punctured with a 21 gauge micropuncture needle. Using standard technique, the initial micro wire was exchanged through a transitional 5 [place] micro sheath for a Bentson wire. A 6 French vascular sheath was then advanced over the Bentson wire and into the common femoral vein. An angled catheter was advanced over the Bentson wire and into the right heart. The Bentson wire was exchanged for a Glidewire which was then navigated into the main pulmonary artery. The main pulmonary arteriogram was performed. This confirms a large filling defect within the right main pulmonary artery consistent with acute pulmonary embolus. There is significantly decreased perfusion to the right lung. The catheter was next navigated into the right main pulmonary artery. Pressures were obtained. The right pulmonary arterial pressure is 42/20 (mean 30). A right pulmonary arteriogram was performed. Again, there is a large filling defect in the right main pulmonary artery extending into the upper lobe branches. There is significantly decreased perfusion in the right lung. The angled catheter was successfully navigated through the thrombus in into a branch of the right lower lobe pulmonary artery. The glidewire was exchanged for a Rosen wire. A 12 cm infusion length EKOS catheter was advanced over the wire and positioned across the thrombus. Pulmonary arterial thrombolysis was then initiated at a rate of 1 milligram/hour. The catheters were secured in place and a sterile bandage applied. The patient tolerated the procedure well and  was returned to the intensive care unit. FINDINGS: Insufficient right-sided pleural fluid for thoracentesis. Right main pulmonary arterial pressure 42/20 (30) mm Hg Large  volume thrombus within the right main pulmonary artery extending into right upper and lower lobar branches with significantly decreased perfusion to the right lung. IMPRESSION: 1. Insufficient right-sided pleural fluid for thoracentesis. Thoracentesis was not performed. 2. Pulmonary arterial hypertension with a right main pulmonary arterial pressure of 42/20 (30) mm Hg. 3. Initiation of right-sided pulmonary arterial thrombolysis at a rate of 1 milligram/hour which will continue for a total dose of 24 mg. Signed, Sterling Big, MD [facility] Electronically Signed   By: Malachy Moan M.D.   On: 02/11/2016 10:09   Ir Rande Lawman F/u Eval Art/ven Final Day (ms)  02/11/2016  INDICATION: 51 year old female with alcoholic pancreatitis, acute respiratory failure secondary to ARDS. Hospital course complicated fine large acute right pulmonary embolus with right heart strain. She is now status post right PE lysis with the EKOS infusion catheter. EXAM: IR THROMB F/U EVAL ART/VEN FINAL DAY MEDICATIONS: None. ANESTHESIA/SEDATION: None. The patient's level of consciousness and vital signs were monitored continuously by radiology nursing throughout the procedure under my direct supervision. CONTRAST:  None. FLUOROSCOPY TIME:  Fluoroscopy Time: 6 seconds (0.2 mGy). COMPLICATIONS: None immediate. PROCEDURE: Informed consent was obtained from the patient following explanation of the procedure, risks, benefits and alternatives. The patient understands, agrees and consents for the procedure. All questions were addressed. A time out was performed prior to the initiation of the procedure. Maximal barrier sterile technique utilized including caps, mask, sterile gowns, sterile gloves, large sterile drape, hand hygiene, and Betadine prep. Under sterile conditions, the existing right pulmonary artery EKOS infusion wire was removed. PA pressure measurements were obtained  through the infusion catheter. Patient is now status post 12 hour protocol for right pulmonary embolus EKOS lysis. Post lysis right PA pressure:  30/16 (22) IMPRESSION: Successful completion of the right PE lysis with the EKOS infusion catheter. Post lysis right PA pressure 30/16 (22) Electronically Signed   By: Judie Petit.  Shick M.D.   On: 02/11/2016 14:59    Labs:  CBC:  Recent Labs  02/10/16 0038 02/11/16 0335 02/12/16 0613 02/13/16 0443  WBC 17.2* 18.7* 18.4* 24.1*  HGB 9.4* 9.0* 10.3* 9.4*  HCT 29.8* 28.8* 32.6* 30.4*  PLT 166 231 233 228    COAGS:  Recent Labs  Mar 08, 2016 2305 02/12/16 0148 02/13/16 0443  INR 1.34 2.49* 1.42  APTT 38*  --  129*    BMP:  Recent Labs  02/12/16 2003 02/13/16 0017 02/13/16 0443 02/13/16 0908  NA 139 140 142 144  K 3.7 3.7 2.7* 3.1*  CL 91* 92* 90* 90*  CO2 39* 38* 42* 44*  GLUCOSE 386* 362* 266* 240*  BUN 7 8 8 7   CALCIUM 7.2* 7.4* 7.4* 7.4*  CREATININE 0.53 0.50 0.49 0.47  GFRNONAA >60 >60 >60 >60  GFRAA >60 >60 >60 >60    LIVER FUNCTION TESTS:  Recent Labs  02/10/16 0038 02/11/16 0335 02/12/16 0148 02/13/16 0443  BILITOT 0.5 0.7 0.8 0.6  AST 67* 118* 129* 60*  ALT 25 28 38 27  ALKPHOS 87 96 105 95  PROT 4.5* 4.2* 5.2* 5.0*  ALBUMIN 1.6* 1.4* 1.6* 1.7*    Assessment and Plan:  B PE/ R central PE + R heart strain PE lysis 3/28-29 On vent New venous upper extr thrombus Plan per Dr Tyson Alias  Electronically Signed: Ralene Muskrat A 02/13/2016,  9:43 AM   I spent a total of 15 Minutes at the the patient's bedside AND on the patient's hospital floor or unit, greater than 50% of which was counseling/coordinating care for PE Lysis 3/28

## 2016-02-13 NOTE — Progress Notes (Signed)
CRITICAL VALUE ALERT  Critical value received: K 2.7; CO2 62     Date of notification:  3/31  Time of notification:  0520  Critical value read back:Yes.    Nurse who received alert:  Kathlen ModyJ Rodolphe Edmonston   Responding MD:  Dr. Darrick Pennaeterding  Time MD responded:  [phone]

## 2016-02-13 NOTE — Progress Notes (Signed)
*  Preliminary Results* Bilateral lower extremity venous duplex completed. Visualized veins of bilateral lower extremities are negative for deep vein thrombosis. There is no evidence of Baker's cyst bilaterally.  *Preliminary Results* Bilateral upper extremity venous duplex completed. Right upper extremity is positive for deep vein thrombosis-  The right internal jugular vein exhibits mobile acute deep vein thrombosis.  There is acute occlusive deep vein thrombosis involving the right subclavian and right axillary veins.  There is acute, nonocclusive deep vein thrombosis involving a single right brachial vein; the other brachial vein is patent and compressible.  The right cephalic vein exhibits acute occlusive superficial vein thrombosis.   Left upper extremity is negative for deep vein thrombosis.  There is evidence of acute occlusive superficial vein thrombosis involving the entire visualized portion of the left cephalic vein, including around the IV.  Patent visualized veins of upper and lower extremities exhibit pulsatile flow, suggestive of possible elevated right sided heart pressure.  Preliminary results discussed with Dr. Tyson AliasFeinstein.  02/13/2016 10:08 AM  Gertie FeyMichelle Shaheen Mende, RVT, RDCS, RDMS

## 2016-02-13 NOTE — Progress Notes (Signed)
ANTICOAGULATION CONSULT NOTE - Follow Up Consult  Pharmacy Consult for Heparin Indication: pulmonary embolus  No Known Allergies  Patient Measurements: Height: 5\' 1"  (154.9 cm) Weight: 141 lb 1.5 oz (64 kg) IBW/kg (Calculated) : 47.8 Heparin Dosing Weight: 62 kg  Vital Signs: Temp: 97.6 F (36.4 C) (03/31 0827) Temp Source: Oral (03/31 0827) BP: 111/63 mmHg (03/31 0900) Pulse Rate: 92 (03/31 0900)  Labs:  Recent Labs  02/11/16 0335  02/12/16 0148 02/12/16 [id number] 02/12/16 1145  02/13/16 0017 02/13/16 0443 02/13/16 0908  HGB 9.0*  --   --  10.3*  --   --   --  9.4*  --   HCT 28.8*  --   --  32.6*  --   --   --  30.4*  --   PLT 231  --   --  233  --   --   --  228  --   APTT  --   --   --   --   --   --   --  129*  --   LABPROT  --   --  26.6*  --   --   --   --  17.5*  --   INR  --   --  2.49*  --   --   --   --  1.42  --   HEPARINUNFRC 0.62  --  0.50  --  0.46  --   --  0.53  --   CREATININE 0.59  < > 0.64  --   --   < > 0.50 0.49 0.47  < > = values in this interval not displayed.  Estimated Creatinine Clearance: 74.5 mL/min (by C-G formula based on Cr of 0.47).   Assessment: [age] YOF who presented [facility]16 with likely EtOH pancreatitis and ARDS for ventilator management. She was started on IV heparin for acute PE and is now s/p EKOS. Mobile clot in R-IJ (removing), R-subclavian and axillary DVT.   Heparin level remains at goal. No bleeding noted.   Goal of Therapy:  Heparin level 0.3-0.7 units/ml Monitor platelets by anticoagulation protocol: Yes   Plan:  - Continue heparin gtt at 1000 units/hr  - Daily heparin level and CBC  Link SnufferJessica Juron Vorhees, PharmD, BCPS Clinical Pharmacist [phone]  02/13/2016 10:57 AM

## 2016-02-13 NOTE — Progress Notes (Addendum)
PULMONARY / CRITICAL CARE MEDICINE   Name: Abigail Baker MRN: [id number] DOB: 06/19/1966    ADMISSION DATE:  02/02/2016  CHIEF COMPLAINT:  Pancreatitis  HISTORY OF PRESENT ILLNESS:   50 yo F with h/o DM, Etoh abuse, who presented to [facility] on 01/29/2016 after several weeks of abdominal pain, n/v.  She was found to have most likely EtOH pancreatitis, she developed ARDS and reportedly b/l PNA for which she was treated with abx.  She also developed a pancreatic pseudocyst.  She was transferred to [facility] hospital for 24/7 CCM and advanced ventilator management given bouts of hypoxemia despite high PEEP/FiO2  SUBJECTIVE:  DVt mobile rt ij this am  Some loose stools Hypokalemia from diuresis Neg 2.8 liters  VITAL SIGNS: Temp:  [97.6 F (36.4 C)-98.4 F (36.9 C)] 97.6 F (36.4 C) (03/31 0827) Pulse Rate:  [60-92] 92 (03/31 0900) Resp:  [12-22] 21 (03/31 0900) BP: (102-126)/(54-87) 111/63 mmHg (03/31 0900) SpO2:  [84 %-98 %] 90 % (03/31 0900) Arterial Line BP: (85-137)/(43-67) 103/49 mmHg (03/31 0930) FiO2 (%):  [70 %-100 %] 90 % (03/31 0755) Weight:  [64 kg (141 lb 1.5 oz)] 64 kg (141 lb 1.5 oz) (03/31 0316) HEMODYNAMICS:   VENTILATOR SETTINGS: Vent Mode:  [-] PCV FiO2 (%):  [70 %-100 %] 90 % Set Rate:  [6 bmp-12 bmp] 12 bmp Vt Set:  [340 mL] 340 mL PEEP:  [8 cmH20-12 cmH20] 10 cmH20 Plateau Pressure:  [15 cmH20-32 cmH20] 23 cmH20 INTAKE / OUTPUT:  Intake/Output Summary (Last 24 hours) at 02/13/16 1053 Last data filed at 02/13/16 0900  Gross per 24 hour  Intake 2846.22 ml  Output   4750 ml  Net -1903.78 ml    PHYSICAL EXAMINATION: General:  Sedated. No distress Neuro:  Intubated and sedated. Pupils equal 5 mm, rass -2 HEENT:  Endotracheal tube in place, jvd down Cardiovascular:  Regular rate. Anasarca improved Normal S1 & S2. Lungs:  Reduced ronchi Abdomen:  Soft. Nondistended. BS wnl Integument:  Warm & Dry. No rash on exposed skin.  LABS:  CBC  Recent  Labs Lab 02/11/16 0335 02/12/16 0613 02/13/16 0443  WBC 18.7* 18.4* 24.1*  HGB 9.0* 10.3* 9.4*  HCT 28.8* 32.6* 30.4*  PLT 231 233 228   Coag's  Recent Labs Lab 02/03/2016 2305 02/12/16 0148 02/13/16 0443  APTT 38*  --  129*  INR 1.34 2.49* 1.42   BMET  Recent Labs Lab 02/13/16 0017 02/13/16 0443 02/13/16 0908  NA 140 142 144  K 3.7 2.7* 3.1*  CL 92* 90* 90*  CO2 38* 42* 44*  BUN CREATININE 0.50 0.49 0.47  GLUCOSE 362* 266* 240*   Electrolytes  Recent Labs Lab 02/12/16 1216  02/13/16 0017 02/13/16 0443 02/13/16 0908  CALCIUM 7.2*  < > 7.4* 7.4* 7.4*  MG 1.6*  --  2.4 2.2  --   PHOS 1.8*  --  2.8 2.5  --   < > = values in this interval not displayed. Sepsis Markers  Recent Labs Lab 01/20/2016 2305 02/11/16 0335 02/12/16 0148  LATICACIDVEN 1.6  --   --   PROCALCITON 0.14 0.36 0.44   ABG  Recent Labs Lab 02/12/16 1245 02/12/16 1404 02/13/16 0350  PHART 7.552* 7.448 7.456*  PCO2ART 49.6* 64.4* 62.0*  PO2ART 56.0* 97.0 70.9*   Liver Enzymes  Recent Labs Lab 02/11/16 0335 02/12/16 0148 02/13/16 0443  AST 118* 129* 60*  ALT 28 38 27  ALKPHOS 96 105 95  BILITOT  0.7 0.8 0.6  ALBUMIN 1.4* 1.6* 1.7*   Cardiac Enzymes  Recent Labs Lab 02/09/16 1940 02/10/16 0038 02/10/16 0634  TROPONINI 0.10* 0.09* 0.06*   Glucose  Recent Labs Lab 02/12/16 1133 02/12/16 1529 02/12/16 2030 02/13/16 0011 02/13/16 0322 02/13/16 0826  GLUCAP 379* 356* 360* 371* 272* 230*    Imaging Dg Chest Port 1 View  02/13/2016  CLINICAL DATA:  ARDS. EXAM: PORTABLE CHEST 1 VIEW COMPARISON:  02/12/2016 FINDINGS: Endotracheal tube, right IJ line, feeding tube in stable position. Heart size normal. Diffuse bilateral airspace disease unchanged. No pleural effusion or pneumothorax . IMPRESSION: 1. Lines and tubes in stable position. 2. Diffuse bilateral airspace disease.  No interim change. Electronically Signed   By: Maisie Fushomas  Register   On: 02/13/2016 07:17    EVENTS: 3/16 - Admit to [facility] 3/19 - Worsening hypoxia & altered mentation. Transferred to ICU. Failed trial of BiPAP & was intubated. 3/26 - [facility] PE noted, EKOS perfromed  STUDIES: CT ABD/PELVIS OSH 3/23:  Subacute pancreatitis w/ 6.9x3.6cm pseudocyst in lesser sac & 7mm pseudocyst in tail. Moderate bilateral pleural effusions.  Port CXR 3/26:  Personally reviewed by me. R IJ at cavoatrial junction. ETT 2cm above carina. Bilateral opacities. CTA Chest 3/27:  Moderate bilateral pleural effusions. Diffuse lung opacities. Small PE left lower lobe branch & large central PE right pulmonary artery w/ RV/LV ratio 1.1. KUB 3/27:  Feeding tube in stomach. Doppler all ext>>>The right internal jugular vein exhibits mobile acute deep vein thrombosis.  There is acute occlusive deep vein thrombosis involving the right subclavian and right axillary veins.  There is acute, nonocclusive deep vein thrombosis involving a single right brachial vein; the other brachial vein is patent and compressible.  The right cephalic vein exhibits acute occlusive superficial vein thrombosis.  acute occlusive superficial vein thrombosis involving the entire visualized portion of the left cephalic vein, including around the IV  MICROBIOLOGY: MRSA PCR 3/26:  Negative  3/27- candidia  ANTIBIOTICS: None.  LINES/TUBES: OETT [place] 3/19>>> R IJ CVL 3/19 or 3/20>>>dc 3/31 as IJ clot Left IJ planned 3/31>>> OGT>>> Foley>>> R Radial Art Line 3/27>>>  ASSESSMENT / PLAN:  PULMONARY A: Bilateral PE - Large R Central PE. ARDS Bilateral Pleural Effusions Tobacco Use Does NOT tolerate APRV  P:   Hep drip remain Peep 12 if unable to get to 60% May consider recruitment maneuvers pcxr in am  Continued neg balance abg reviewed, PC to 8, abg in am   CARDIOVASCULAR A:  Shock apparently looking like sedation related Anasarca CLOT associated with rt IJ , line, mobile AI P:   Neo-Synephrine to maintain MAP>>60 stress steroids and add flroinef Will dc line neck, called IR there is no procedure to remove this will place new line left and attempt to NOT hold heparin   RENAL A:   NonAGMA AGMA - Resolved. 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 Severe Protein-Calorie Malnutrition At risk hemorrhagic conversion pacrease P:   Protonix IV q24hr T f tolerated  HEMATOLOGIC A:   PE Anemia - No signs of active bleeding. S/P previous transfusion for Hgb of 8. Leukocytosis - Increasing.  coagulapthy sepsis? Consumption from clot  P:  Heparin drip per pharmacy protocol, elected to NOT give ffp, vit k or  hold heparin for trach Likely needs trach next week , but reluctant now to hold anticoagulation with recent PE and now mobile clot IJ  INFECTIOUS A:   Fever 3/28 On multiple antibiotics previously. Loose stool, no fever P:   Pct neg x 3 Zosyn dc Get cdiff  ENDOCRINE A:   H/O DM Type I R/o rel AI P:   Accu-Checks q4hr  SSI per algorithm Increase lantus to 25  NEUROLOGIC A:   Sedation on Ventilator H/O EtOH Use  P:   RASS goal: 0 to -1 Fentanly gtt & IV prn Versed gtt & IV prn Thiamine & FA IV daily WUA  needed  Ccm time 35 min   Mom updated daily  Mcarthur Rossetti. Tyson Alias, MD, FACP Pgr: [phone] [facility]

## 2016-02-13 NOTE — Progress Notes (Addendum)
Left IJ central venous catheter malpositioned after initial insertion. AM rounding physician called and made aware who placed order for on-call provider to pull line back 5cm. Per rounding MD orders: venous gas obtained from new line, arterial gas obtained from radial arterial line, and cvp connected to new line to assess wave form. On-call physician repositioned the line, new chest xray was obtained to confirm placement. Radiology called and informed me that the line was still malpositioned and needed to be completely replaced. AM rounding MD made aware but he instructed on-call physician to pull back line 3cm and recheck position. This was passed off to next shift.

## 2016-02-13 NOTE — Progress Notes (Signed)
eLink Physician-Brief Progress Note Patient Name: Abigail Baker DOB: 11/26/1965 MRN: [id number]   Date of Service  02/13/2016  HPI/Events of Note  Hypokalemia  eICU Interventions  Potassium replaced     Intervention Category Intermediate Interventions: Electrolyte abnormality - evaluation and management  DETERDING,ELIZABETH 02/13/2016, 10:35 PM

## 2016-02-14 ENCOUNTER — Inpatient Hospital Stay (HOSPITAL_COMMUNITY): Payer: BLUE CROSS/BLUE SHIELD

## 2016-02-14 LAB — COMPREHENSIVE METABOLIC PANEL
ALT: 25 U/L (ref 14–54)
AST: 51 U/L — AB (ref 15–41)
Albumin: 1.8 g/dL — ABNORMAL LOW (ref 3.5–5.0)
Alkaline Phosphatase: 94 U/L (ref 38–126)
BILIRUBIN TOTAL: 0.6 mg/dL (ref 0.3–1.2)
BUN: 5 mg/dL — ABNORMAL LOW (ref 6–20)
CHLORIDE: 85 mmol/L — AB (ref 101–111)
CO2: >50 mmol/L — ABNORMAL HIGH (ref 22–32)
CREATININE: 0.56 mg/dL (ref 0.44–1.00)
Calcium: 7.4 mg/dL — ABNORMAL LOW (ref 8.9–10.3)
Glucose, Bld: 287 mg/dL — ABNORMAL HIGH (ref 65–99)
POTASSIUM: 2.8 mmol/L — AB (ref 3.5–5.1)
Sodium: 145 mmol/L (ref 135–145)
Total Protein: 4.8 g/dL — ABNORMAL LOW (ref 6.5–8.1)

## 2016-02-14 LAB — BLOOD GAS, ARTERIAL
Acid-Base Excess: 26.7 mmol/L — ABNORMAL HIGH (ref 0.0–2.0)
BICARBONATE: 52.8 meq/L — AB (ref 20.0–24.0)
Drawn by: 437071
FIO2: 0.9
O2 Saturation: 91.1 %
PATIENT TEMPERATURE: 99
PEEP/CPAP: 10 cmH2O
PO2 ART: 64 mmHg — AB (ref 80.0–100.0)
Pressure control: 8 cmH2O
RATE: 24 resp/min
TCO2: 54.9 mmol/L (ref 0–100)
pCO2 arterial: 69.1 mmHg (ref 35.0–45.0)
pH, Arterial: 7.496 — ABNORMAL HIGH (ref 7.350–7.450)

## 2016-02-14 LAB — MAGNESIUM
MAGNESIUM: 1.8 mg/dL (ref 1.7–2.4)
Magnesium: 2.2 mg/dL (ref 1.7–2.4)

## 2016-02-14 LAB — CBC WITH DIFFERENTIAL/PLATELET
Basophils Absolute: 0 10*3/uL (ref 0.0–0.1)
Basophils Relative: 0 %
EOS PCT: 0 %
Eosinophils Absolute: 0 10*3/uL (ref 0.0–0.7)
HEMATOCRIT: 29.6 % — AB (ref 36.0–46.0)
Hemoglobin: 8.7 g/dL — ABNORMAL LOW (ref 12.0–15.0)
LYMPHS PCT: 4 %
Lymphs Abs: 0.6 10*3/uL — ABNORMAL LOW (ref 0.7–4.0)
MCH: 30.7 pg (ref 26.0–34.0)
MCHC: 29.4 g/dL — AB (ref 30.0–36.0)
MCV: 104.6 fL — AB (ref 78.0–100.0)
MONO ABS: 1.1 10*3/uL — AB (ref 0.1–1.0)
MONOS PCT: 7 %
NEUTROS ABS: 13.8 10*3/uL — AB (ref 1.7–7.7)
Neutrophils Relative %: 89 %
Platelets: 215 10*3/uL (ref 150–400)
RBC: 2.83 MIL/uL — ABNORMAL LOW (ref 3.87–5.11)
RDW: 17.1 % — AB (ref 11.5–15.5)
WBC: 15.5 10*3/uL — ABNORMAL HIGH (ref 4.0–10.5)

## 2016-02-14 LAB — GLUCOSE, CAPILLARY
GLUCOSE-CAPILLARY: 288 mg/dL — AB (ref 65–99)
GLUCOSE-CAPILLARY: 267 mg/dL — AB (ref 65–99)
GLUCOSE-CAPILLARY: 145 mg/dL — AB (ref 65–99)
Glucose-Capillary: 242 mg/dL — ABNORMAL HIGH (ref 65–99)
Glucose-Capillary: 272 mg/dL — ABNORMAL HIGH (ref 65–99)
Glucose-Capillary: 286 mg/dL — ABNORMAL HIGH (ref 65–99)
Glucose-Capillary: 319 mg/dL — ABNORMAL HIGH (ref 65–99)

## 2016-02-14 LAB — HEPARIN LEVEL (UNFRACTIONATED): HEPARIN UNFRACTIONATED: 0.68 [IU]/mL (ref 0.30–0.70)

## 2016-02-14 LAB — PHOSPHORUS: PHOSPHORUS: 2 mg/dL — AB (ref 2.5–4.6)

## 2016-02-14 MED ORDER — POTASSIUM CHLORIDE 20 MEQ/15ML (10%) PO SOLN
ORAL | Status: AC
  Filled 2016-02-14: qty 30

## 2016-02-14 MED ORDER — POTASSIUM CHLORIDE 20 MEQ/15ML (10%) PO SOLN
40.0000 meq | ORAL | Status: AC
  Administered 2016-02-14 (×2): 40 meq
  Filled 2016-02-14 (×2): qty 30

## 2016-02-14 MED ORDER — POTASSIUM CHLORIDE 20 MEQ/15ML (10%) PO SOLN
40.0000 meq | Freq: Once | ORAL | Status: AC
  Administered 2016-02-14: 40 meq

## 2016-02-14 MED ORDER — MAGNESIUM SULFATE 2 GM/50ML IV SOLN
2.0000 g | Freq: Once | INTRAVENOUS | Status: AC
  Administered 2016-02-14: 2 g via INTRAVENOUS
  Filled 2016-02-14: qty 50

## 2016-02-14 MED ORDER — SODIUM CHLORIDE 0.9 % IV SOLN: INTRAVENOUS | Status: DC | PRN

## 2016-02-14 MED ORDER — INSULIN ASPART 100 UNIT/ML ~~LOC~~ SOLN
8.0000 [IU] | SUBCUTANEOUS | Status: DC
  Administered 2016-02-14 – 2016-02-15 (×5): 8 [IU] via SUBCUTANEOUS

## 2016-02-14 MED ORDER — "THROMBI-PAD 3""X3"" EX PADS"
2.0000 | MEDICATED_PAD | Freq: Once | CUTANEOUS | Status: DC
  Filled 2016-02-14: qty 2

## 2016-02-14 MED ORDER — POTASSIUM PHOSPHATES 15 MMOLE/5ML IV SOLN
30.0000 mmol | Freq: Once | INTRAVENOUS | Status: AC
  Administered 2016-02-14: 30 mmol via INTRAVENOUS
  Filled 2016-02-14: qty 10

## 2016-02-14 NOTE — Progress Notes (Signed)
eLink Physician-Brief Progress Note Patient Name: Abigail Baker DOB: 01/15/1966 MRN: [id number]   Date of Service  02/14/2016  HPI/Events of Note  Bedside nurse reports some oozing from site of previously central line. Also potassium replaced today but no repeat check.  eICU Interventions  1. Probably pants 2 2. BMP now     Intervention Category Intermediate Interventions: Electrolyte abnormality - evaluation and management;Other:  Lawanda CousinsJennings Nollie Shiflett 02/14/2016, 11:22 PM

## 2016-02-14 NOTE — Progress Notes (Signed)
ANTICOAGULATION CONSULT NOTE - Follow Up Consult  Pharmacy Consult for Heparin Indication: pulmonary embolus  No Known Allergies  Patient Measurements: Height: 5\' 1"  (154.9 cm) Weight: 138 lb 10.7 oz (62.9 kg) IBW/kg (Calculated) : 47.8 Heparin Dosing Weight: 62 kg  Vital Signs: Temp: 99.4 F (37.4 C) (04/01 0754) Temp Source: Oral (04/01 0754) BP: 147/85 mmHg (04/01 0900) Pulse Rate: 97 (04/01 0900)  Labs:  Recent Labs  02/12/16 0148  02/12/16 [id number] 02/12/16 1145  02/13/16 0443  02/13/16 1237 02/13/16 1830 02/14/16 0515  HGB  --   < > 10.3*  --   --  9.4*  --   --   --  8.7*  HCT  --   --  32.6*  --   --  30.4*  --   --   --  29.6*  PLT  --   --  233  --   --  228  --   --   --  215  APTT  --   --   --   --   --  129*  --   --   --   --   LABPROT 26.6*  --   --   --   --  17.5*  --   --   --   --   INR 2.49*  --   --   --   --  1.42  --   --   --   --   HEPARINUNFRC 0.50  --   --  0.46  --  0.53  --   --   --  0.68  CREATININE 0.64  --   --   --   < > 0.49  < > 0.51 0.52 0.56  < > = values in this interval not displayed.  Estimated Creatinine Clearance: 73.8 mL/min (by C-G formula based on Cr of 0.56).   Assessment: [age] YOF who [facility] 01/29/16 with likely EtOH pancreatitis and ARDS for ventilator management. She was started on IV heparin for acute PE and is now s/p EKOS. Mobile clot in R-IJ (removing), R-subclavian and axillary DVT. Heparin level remains at goal. No bleeding noted.   Goal of Therapy:  Heparin level 0.3-0.7 units/ml Monitor platelets by anticoagulation protocol: Yes   Plan:  - Continue heparin gtt at 1000 units/hr  - Daily heparin level and CBC  Lysle Pearlachel Zaelyn Barbary, PharmD, BCPS Pager # [phone] 02/14/2016 9:52 AM

## 2016-02-14 NOTE — Progress Notes (Signed)
PULMONARY / CRITICAL CARE MEDICINE   Name: Abigail Baker MRN: [id number] DOB: 01/10/1966    ADMISSION DATE:  18-Jan-2016  CHIEF COMPLAINT:  Pancreatitis  HISTORY OF PRESENT ILLNESS:   50 yo F with h/o DM, Etoh abuse, who presented to [facility] on 01/29/2016 after several weeks of abdominal pain, n/v.  She was found to have most likely EtOH pancreatitis, she developed ARDS and reportedly b/l PNA for which she was treated with abx.  She also developed a pancreatic pseudocyst.  She was transferred to [facility] for 24/7 CCM and advanced ventilator management given bouts of hypoxemia despite high PEEP/FiO2  SUBJECTIVE:  DVt mobile rt ij this am  Some loose stools Hypokalemia from diuresis Neg 2.8 liters  VITAL SIGNS: Temp:  [98.3 F (36.8 C)-99.4 F (37.4 C)] 98.3 F (36.8 C) (04/01 1216) Pulse Rate:  [67-109] 99 (04/01 1300) Resp:  [16-31] 26 (04/01 1300) BP: (89-154)/(47-85) 154/83 mmHg (04/01 1300) SpO2:  [82 %-99 %] 82 % (04/01 1300) Arterial Line BP: (91-151)/(40-67) 151/67 mmHg (04/01 0900) FiO2 (%):  [70 %-100 %] 70 % (04/01 1155) Weight:  [138 lb 10.7 oz (62.9 kg)] 138 lb 10.7 oz (62.9 kg) (04/01 0500) HEMODYNAMICS: CVP:  [13 mmHg] 13 mmHg VENTILATOR SETTINGS: Vent Mode:  [-] PCV FiO2 (%):  [70 %-100 %] 70 % Set Rate:  [24 bmp] 24 bmp PEEP:  [10 cmH20] 10 cmH20 Plateau Pressure:  [18 cmH20-26 cmH20] 22 cmH20 INTAKE / OUTPUT:  Intake/Output Summary (Last 24 hours) at 02/14/16 1359 Last data filed at 02/14/16 1300  Gross per 24 hour  Intake 2902.5 ml  Output   4950 ml  Net -2047.5 ml    PHYSICAL EXAMINATION: General:  Sedated. No distress; still on high FIO2 Neuro:  Intubated and sedated. Pupils equal, rass -3 HEENT:  Endotracheal tube in place, jvd down Cardiovascular:  Regular rate. Anasarca improved Normal S1 & S2. Lungs: diffuse rhonchi; no accessory use  Abdomen:  Soft. Nondistended. BS wnl Integument:  Warm & Dry. No rash on exposed  skin.  LABS:  CBC  Recent Labs Lab 02/12/16 0613 02/13/16 0443 02/14/16 0515  WBC 18.4* 24.1* 15.5*  HGB 10.3* 9.4* 8.7*  HCT 32.6* 30.4* 29.6*  PLT 233 228 215   Coag's  Recent Labs Lab 04/21/2016 2305 02/12/16 0148 02/13/16 0443  APTT 38*  --  129*  INR 1.34 2.49* 1.42   BMET  Recent Labs Lab 02/13/16 1237 02/13/16 1830 02/14/16 0515  NA 143 144 145  K 3.3* 2.8* 2.8*  CL 89* 87* 85*  CO2 43* 47* >50*  BUN 8 9 5*  CREATININE 0.51 0.52 0.56  GLUCOSE 366* 321* 287*   Electrolytes  Recent Labs Lab 02/13/16 0443  02/13/16 1237 02/13/16 1830 02/14/16 0515  CALCIUM 7.4*  < > 7.3* 7.3* 7.4*  MG 2.2  --   --  1.9 1.8  PHOS 2.5  --   --  2.6 2.0*  < > = values in this interval not displayed. Sepsis Markers  Recent Labs Lab 04/21/2016 2305 02/11/16 0335 02/12/16 0148  LATICACIDVEN 1.6  --   --   PROCALCITON 0.14 0.36 0.44   ABG  Recent Labs Lab 02/13/16 0350 02/13/16 1421 02/14/16 0425  PHART 7.456* 7.386 7.496*  PCO2ART 62.0* 81.6* 69.1*  PO2ART 70.9* 134.0* 64.0*   Liver Enzymes  Recent Labs Lab 02/12/16 0148 02/13/16 0443 02/14/16 0515  AST 129* 60* 51*  ALT 38 27 25  ALKPHOS 105 95 94  BILITOT  0.8 0.6 0.6  ALBUMIN 1.6* 1.7* 1.8*   Cardiac Enzymes  Recent Labs Lab 02/09/16 1940 02/10/16 0038 02/10/16 0634  TROPONINI 0.10* 0.09* 0.06*   Glucose  Recent Labs Lab 02/13/16 1524 02/13/16 1933 02/13/16 2338 02/14/16 0326 02/14/16 0756 02/14/16 1219  GLUCAP 311* 288* 319* 286* 242* 272*    Imaging Dg Chest Port 1 View  02/14/2016  CLINICAL DATA:  Central line readjustment.  Initial encounter. EXAM: PORTABLE CHEST 1 VIEW COMPARISON:  Chest radiograph performed 02/13/2016 FINDINGS: The patient's left IJ line appears to have straightened somewhat since the prior study, though it likely still extends into a small branch off the left brachiocephalic vein. The right IJ line is noted ending about the distal SVC. The patient's  endotracheal tube is seen ending 4 cm above the carina. An enteric tube is noted extending below the diaphragm. Right basilar and diffuse left-sided airspace opacities may reflect multifocal pneumonia or pulmonary edema. No definite pleural effusion or pneumothorax is seen. The cardiomediastinal silhouette is normal in size. No acute osseous abnormalities are identified. IMPRESSION: 1. Left IJ line appears to have straightened somewhat since the prior study, though it likely still extends into a small branch off the left brachiocephalic vein. 2. Right basilar and diffuse left-sided airspace opacities may reflect multifocal pneumonia or pulmonary edema, similar in appearance to the prior study. These results were called by telephone at the time of interpretation on 02/14/2016 at 5:25 am to [facility], who verbally acknowledged these results. Electronically Signed   By: Roanna Raider M.D.   On: 02/14/2016 05:27   Dg Chest Port 1 View  02/13/2016  CLINICAL DATA:  Central line placement. EXAM: PORTABLE CHEST 1 VIEW COMPARISON:  02/13/2016 at 15:55 p.m. and 13:26 p.m. FINDINGS: Patient is rotated to the right. Enteric tube, endotracheal tube and right IJ central venous catheter are unchanged. Left IJ central venous catheter is also unchanged as is looped over the expected region of the left brachiocephalic vein with tip pointing towards the aortic arch. Lungs are adequately inflated with persistent mixed interstitial airspace density over the left lung and right base likely infection. Cardiomediastinal silhouette and remainder of the exam is unchanged. IMPRESSION: Persistent mixed interstitial airspace process over the left lung and right base suggesting infection. Tubes and lines as described. Note that the left IJ central venous catheter is not significantly changed as is looped over the expected region of the left brachiocephalic vein with tip pointing towards the aortic arch. These results were called by  telephone at the time of interpretation on 02/13/2016 at 9:47 pm to patient's nurse, Vilinda Blanks, who verbally acknowledged these results. Electronically Signed   By: Elberta Fortis M.D.   On: 02/13/2016 21:47   Dg Chest Port 1 View  02/13/2016  ADDENDUM REPORT: 02/13/2016 16:26 ADDENDUM: Study discussed by telephone with [facility] [phone] Nurse Lauren on 02/13/2016 at 1621 hours. Electronically Signed   By: Odessa Fleming M.D.   On: 02/13/2016 16:26  02/13/2016  CLINICAL DATA:  50 year old female with repositioning of left central venous line. Initial encounter. EXAM: PORTABLE CHEST 1 VIEW COMPARISON:  1326 hours today and earlier. FINDINGS: No abnormality of the left side central venous anatomy on the 02/09/2016 chest CTA. Portable AP semi upright view at 1600 hours. Unfortunately, the abnormal configuration of the left IJ approach catheter persists on this exam, not significantly changed compared to 1326 hours today. Other lines and tubes appear stable. Mildly increased confluence of interstitial opacity at the  left lung base. Bilateral basilar predominant opacity otherwise stable. No pneumothorax. Stable cardiac size and mediastinal contours. IMPRESSION: 1. Persistent abnormal configuration of the left IJ approach catheter. As the exact catheter position is indeterminate position but I favor it is looped in the left innominate vein. This is not suitable for use. 2. Stable to mild progression of basilar predominant interstitial pulmonary opacity greater on the left. 3. Other lines and tubes are stable. Electronically Signed: By: Odessa Fleming M.D. On: 02/13/2016 16:17   EVENTS: 3/16 - Admit to OSH [facility]) 3/19 - Worsening hypoxia & altered mentation. Transferred to ICU. Failed trial of BiPAP & was intubated. 3/26 - Transfer to [facility] 3/28 PE noted, EKOS perfromed  STUDIES: CT ABD/PELVIS OSH 3/23:  Subacute pancreatitis w/ 6.9x3.6cm pseudocyst in lesser sac & 7mm pseudocyst in tail. Moderate bilateral pleural effusions.   Port CXR 3/26:  Personally reviewed by me. R IJ at cavoatrial junction. ETT 2cm above carina. Bilateral opacities. CTA Chest 3/27:  Moderate bilateral pleural effusions. Diffuse lung opacities. Small PE left lower lobe branch & large central PE right pulmonary artery w/ RV/LV ratio 1.1. KUB 3/27:  Feeding tube in stomach. Doppler all ext>>>The right internal jugular vein exhibits mobile acute deep vein thrombosis.  There is acute occlusive deep vein thrombosis involving the right subclavian and right axillary veins.  There is acute, nonocclusive deep vein thrombosis involving a single right brachial vein; the other brachial vein is patent and compressible.  The right cephalic vein exhibits acute occlusive superficial vein thrombosis.  acute occlusive superficial vein thrombosis involving the entire visualized portion of the left cephalic vein, including around the IV  MICROBIOLOGY: MRSA PCR 3/26:  Negative  3/27- candidia  ANTIBIOTICS: None.  LINES/TUBES: OETT  3/19>>> R IJ CVL 3/19 or 3/20>>>dc 3/31 as IJ clot Left IJ planned 3/31>>> OGT>>> Foley>>> R Radial Art Line 3/27>>>  ASSESSMENT / PLAN:  PULMONARY A: Bilateral PE - Large R Central PE. ARDS Bilateral Pleural Effusions Tobacco Use Does NOT tolerate APRV -aeration a little better. Still w/ high FIO2 needs  P:   Cont Hep drip Go back to peep 12 to get FIO2 </=60 pcxr in am  Continued neg balance  CARDIOVASCULAR A:  Shock apparently looking like sedation related Anasarca CLOT associated with rt IJ , line, mobile AI P:  Neo-Synephrine to maintain MAP>>60 stress steroids and added flroinef Will dc line neck, called IR there is no procedure to remove this Will replace w/ femoral line for now given issues w/ the left malpositioned line   RENAL A:   NonAGMA AGMA - Resolved. 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 Severe Protein-Calorie Malnutrition At risk hemorrhagic conversion pacrease P:   Protonix IV q24hr TF tolerated  HEMATOLOGIC A:   PE Anemia - No signs of active bleeding. S/P previous transfusion for Hgb of 8. Leukocytosis - Increasing.  Extensive DVT of the RUE and superficial thrombus on left  coagulapthy sepsis? Consumption from clot  P:  Heparin drip per pharmacy protocol, elected to NOT give ffp, vit k or hold heparin for trach  Likely needs trach next week , but reluctant now to hold anticoagulation with recent PE and now mobile clot IJ Will d/c left IJ and place femoral for now   INFECTIOUS A:  Fever 3/28 On multiple antibiotics previously-->abx stopped.  P:   trend fever curve   ENDOCRINE A:   H/O DM Type I R/o rel AI P:   Accu-Checks q4hr  SSI per algorithm Add scheduled short acting insulin  Increase lantus to 25  NEUROLOGIC A:   Sedation on Ventilator H/O EtOH Use  P:   RASS goal: 0 to -1 Fentanly gtt & IV prn Versed gtt & IV prn Thiamine & FA IV daily WUA daily Hope to dc gtts after trach   Still requiring high FIO2. Pressor requirement are improving. Issues are failure to wean, acute DVT and left superficial clot on left. Will need both IJs out. Looks like will need trach early next week. Will try to convert back to IV bolus of lasix on 4/2. I am concerned that her prognosis for any meaningful functional recovery is poor.    Simonne Martinet ACNP-BC [facility] Pulmonary/Critical Care Pager # [phone] OR # [phone] if no answer  Attending Note:  50 year old female with pancreatitis and subsequent ARDS, HCAP and septic shock.  On exam, diffuse crackles.  I reviewed CXR myself, ARDS pattern noted, ETT ok.  Will continue lasix drip.  Monitor of abx.  Will replace TLC today.  Sedation as ordered.  Family updated by NP.  Discussed with  PCCM-NP.  The patient is critically ill with multiple organ systems failure and requires high complexity decision making for assessment and support, frequent evaluation and titration of therapies, application of advanced monitoring technologies and extensive interpretation of multiple databases.   Critical Care Time devoted to patient care services described in this note is  35  Minutes. This time reflects time of care of this signee Dr Koren Bound. This critical care time does not reflect procedure time, or teaching time or supervisory time of PA/NP/Med student/Med Resident etc but could involve care discussion time.  Alyson Reedy, M.D. [facility] Pulmonary/Critical Care Medicine. Pager: [phone]. After hours pager: [phone].

## 2016-02-14 NOTE — Progress Notes (Signed)
CRITICAL VALUE ALERT  Critical value received:  ABG 7.49, PaC02 69, PaO2 64, HCO3 52.8  Date of notification:  02/14/16  Time of notification:  0530  Critical value read back:Yes.    Nurse who received alert:  Vilinda BlanksBrandy Loukisha Gunnerson, RN  MD notified (1st page):    Time of first page:  0  MD notified (2nd page):  Time of second page:  Responding MD:  Dr. Darrick Pennaeterding  Time MD responded:  [phone]

## 2016-02-14 NOTE — Procedures (Signed)
Arterial Catheter Insertion Procedure Note Hardie Loraammy Mussell [id number] 06/13/1966  Procedure: Insertion of Arterial Catheter  Indications: Blood pressure monitoring and Frequent blood sampling  Procedure Details Consent: Risks of procedure as well as the alternatives and risks of each were explained to the (patient/caregiver).  Consent for procedure obtained. Time Out: Verified patient identification, verified procedure, site/side was marked, verified correct patient position, special equipment/implants available, medications/allergies/relevent history reviewed, required imaging and test results available.  Performed  Maximum sterile technique was used including antiseptics, cap, gloves, gown, hand hygiene, mask and sheet. Skin prep: Chlorhexidine; local anesthetic administered 22 gauge catheter was inserted into left radial artery using the Seldinger technique.  Evaluation Blood flow good; BP tracing good. Complications: No apparent complications.   Antoine Pocherogdon, Laquashia Mergenthaler Caroline 02/14/2016

## 2016-02-14 NOTE — Progress Notes (Signed)
eLink Physician-Brief Progress Note Patient Name: Abigail Baker DOB: 06/05/1966 MRN: [id number]   Date of Service  02/14/2016  HPI/Events of Note  Hypokalemia and hypophosphatemia  eICU Interventions  Potassium and phos replaced     Intervention Category Intermediate Interventions: Electrolyte abnormality - evaluation and management  DETERDING,ELIZABETH 02/14/2016, 6:37 AM

## 2016-02-14 NOTE — Progress Notes (Signed)
15ml Fentanyl wasted with Caralee AtesJason Toomes, RN.  5ml Versed wasted with Remonia RichterLindsey Walsh, RN.

## 2016-02-14 NOTE — Procedures (Signed)
Central Venous Catheter Insertion Procedure Note Hardie Loraammy Simonich [id number] 11/17/1965  Procedure: Insertion of Central Venous Catheter Indications: Drug and/or fluid administration and Frequent blood sampling  Procedure Details Consent: Risks of procedure as well as the alternatives and risks of each were explained to the (patient/caregiver).  Consent for procedure obtained. Time Out: Verified patient identification, verified procedure, site/side was marked, verified correct patient position, special equipment/implants available, medications/allergies/relevent history reviewed, required imaging and test results available.  Performed  Maximum sterile technique was used including antiseptics, cap, gloves, gown, hand hygiene, mask and sheet. Skin prep: Chlorhexidine; local anesthetic administered A antimicrobial bonded/coated triple lumen catheter was placed in the left femoral vein due to multiple attempts, no other available access using the Seldinger technique.  Evaluation Blood flow good Complications: No apparent complications Patient did tolerate procedure well. Chest X-ray ordered to verify placement.  CXR: Not needed.  U/S used in placement.  Macallister Ashmead 02/14/2016, 5:34 PM

## 2016-02-14 NOTE — Progress Notes (Signed)
eLink Nursing ICU Electrolyte Replacement Protocol  Patient Name: Abigail Baker DOB: 05/30/1966 MRN: [id number]  Date of Service  02/14/2016   HPI/Events of Note    Recent Labs Lab 02/12/16 1216  02/13/16 0017 02/13/16 0443 02/13/16 0908 02/13/16 1237 02/13/16 1830 02/14/16 0515  NA 140  < > 140 142 144 143 144 145  K 2.0*  < > 3.7 2.7* 3.1* 3.3* 2.8* 2.8*  CL 88*  < > 92* 90* 90* 89* 87* 85*  CO2 40*  < > 38* 42* 44* 43* 47* >50*  GLUCOSE 399*  < > 362* 266* 240* 366* 321* 287*  BUN 8  < > 8 8 7 8 9  5*  CREATININE 0.57  < > 0.50 0.49 0.47 0.51 0.52 0.56  CALCIUM 7.2*  < > 7.4* 7.4* 7.4* 7.3* 7.3* 7.4*  MG 1.6*  --  2.4 2.2  --   --  1.9 1.8  PHOS 1.8*  --  2.8 2.5  --   --  2.6 2.0*  < > = values in this interval not displayed.  Estimated Creatinine Clearance: 73.8 mL/min (by C-G formula based on Cr of 0.56).  Intake/Output      03/31 0701 - 04/01 0700   I.V. (mL/kg) 1271.9 (20.2)   NG/GT 997.3   IV Piggyback 250   Total Intake(mL/kg) 2519.2 (40.1)   Urine (mL/kg/hr) 4100 (2.7)   Stool 700 (0.5)   Total Output 4800   Net -2280.8        - I/O DETAILED x24h    Total I/O In: 1208.5 [I.V.:518.5; NG/GT:540; IV Piggyback:150] Out: 2225 [Urine:1775; Stool:450] - I/O THIS SHIFT    ASSESSMENT   eICURN Interventions  K+ replaced using electrolyte protocol   ASSESSMENT: MAJOR ELECTROLYTE    Merita NortonFrye, Beckem Tomberlin Nicole 02/14/2016, 6:39 AM

## 2016-02-14 DEATH — deceased

## 2016-02-15 LAB — COMPREHENSIVE METABOLIC PANEL
ALT: 24 U/L (ref 14–54)
AST: 50 U/L — ABNORMAL HIGH (ref 15–41)
Albumin: 1.8 g/dL — ABNORMAL LOW (ref 3.5–5.0)
Alkaline Phosphatase: 83 U/L (ref 38–126)
BUN: 8 mg/dL (ref 6–20)
CALCIUM: 7.7 mg/dL — AB (ref 8.9–10.3)
Chloride: 87 mmol/L — ABNORMAL LOW (ref 101–111)
Creatinine, Ser: 0.44 mg/dL (ref 0.44–1.00)
GFR calc non Af Amer: >60 mL/min (ref >60)
GLUCOSE: 269 mg/dL — AB (ref 65–99)
Potassium: 4.1 mmol/L (ref 3.5–5.1)
SODIUM: 147 mmol/L — AB (ref 135–145)
Total Bilirubin: 0.6 mg/dL (ref 0.3–1.2)
Total Protein: 4.8 g/dL — ABNORMAL LOW (ref 6.5–8.1)

## 2016-02-15 LAB — HEPARIN LEVEL (UNFRACTIONATED)
Heparin Unfractionated: 0.85 IU/mL — ABNORMAL HIGH (ref 0.30–0.70)
Heparin Unfractionated: 0.9 IU/mL — ABNORMAL HIGH (ref 0.30–0.70)

## 2016-02-15 LAB — CBC WITH DIFFERENTIAL/PLATELET
BASOS PCT: 0 %
Basophils Absolute: 0 10*3/uL (ref 0.0–0.1)
EOS PCT: 0 %
Eosinophils Absolute: 0 10*3/uL (ref 0.0–0.7)
HCT: 29.6 % — ABNORMAL LOW (ref 36.0–46.0)
Hemoglobin: 8.5 g/dL — ABNORMAL LOW (ref 12.0–15.0)
Lymphocytes Relative: 9 %
Lymphs Abs: 1.1 10*3/uL (ref 0.7–4.0)
MCH: 30.9 pg (ref 26.0–34.0)
MCHC: 28.7 g/dL — ABNORMAL LOW (ref 30.0–36.0)
MCV: 107.6 fL — ABNORMAL HIGH (ref 78.0–100.0)
MONO ABS: 0.4 10*3/uL (ref 0.1–1.0)
MONOS PCT: 3 %
NEUTROS ABS: 11.2 10*3/uL — AB (ref 1.7–7.7)
Neutrophils Relative %: 88 %
PLATELETS: 170 10*3/uL (ref 150–400)
RBC: 2.75 MIL/uL — AB (ref 3.87–5.11)
RDW: 17 % — ABNORMAL HIGH (ref 11.5–15.5)
WBC: 12.6 10*3/uL — ABNORMAL HIGH (ref 4.0–10.5)

## 2016-02-15 LAB — BASIC METABOLIC PANEL
BUN: 7 mg/dL (ref 6–20)
CO2: >50 mmol/L — ABNORMAL HIGH (ref 22–32)
Calcium: 7.8 mg/dL — ABNORMAL LOW (ref 8.9–10.3)
Chloride: 87 mmol/L — ABNORMAL LOW (ref 101–111)
Creatinine, Ser: 0.38 mg/dL — ABNORMAL LOW (ref 0.44–1.00)
Glucose, Bld: 186 mg/dL — ABNORMAL HIGH (ref 65–99)
POTASSIUM: 3.3 mmol/L — AB (ref 3.5–5.1)
Sodium: 148 mmol/L — ABNORMAL HIGH (ref 135–145)

## 2016-02-15 LAB — GLUCOSE, CAPILLARY
GLUCOSE-CAPILLARY: 202 mg/dL — AB (ref 65–99)
GLUCOSE-CAPILLARY: 189 mg/dL — AB (ref 65–99)
Glucose-Capillary: 115 mg/dL — ABNORMAL HIGH (ref 65–99)
Glucose-Capillary: 250 mg/dL — ABNORMAL HIGH (ref 65–99)
Glucose-Capillary: 280 mg/dL — ABNORMAL HIGH (ref 65–99)

## 2016-02-15 LAB — MAGNESIUM
MAGNESIUM: 2 mg/dL (ref 1.7–2.4)
Magnesium: 1.9 mg/dL (ref 1.7–2.4)

## 2016-02-15 LAB — PROTIME-INR
INR: 1.42 (ref 0.00–1.49)
Prothrombin Time: 17.4 seconds — ABNORMAL HIGH (ref 11.6–15.2)

## 2016-02-15 LAB — APTT: aPTT: >200 seconds (ref 24–37)

## 2016-02-15 MED ORDER — POTASSIUM CHLORIDE 20 MEQ/15ML (10%) PO SOLN
40.0000 meq | Freq: Once | ORAL | Status: AC
  Administered 2016-02-15: 40 meq
  Filled 2016-02-15: qty 30

## 2016-02-15 MED ORDER — POTASSIUM CHLORIDE 10 MEQ/50ML IV SOLN
10.0000 meq | INTRAVENOUS | Status: AC
  Administered 2016-02-15 (×2): 10 meq via INTRAVENOUS
  Filled 2016-02-15 (×2): qty 50

## 2016-02-15 NOTE — Procedures (Signed)
Extubation Procedure Note  Patient Details:   Name: Abigail Baker DOB: 07/15/1966 MRN: [id number]   Airway Documentation:     Evaluation  O2 sats: transiently fell during during procedure Complications: Terminal Extubation Patient did tolerate procedure well. Bilateral Breath Sounds: Diminished, Clear   Family at bedside post-extubation.  Antoine Pocherogdon, Yuvraj Pfeifer Caroline 02/15/2016, 5:58 PM

## 2016-02-15 NOTE — Progress Notes (Signed)
eLink Physician-Brief Progress Note Patient Name: Abigail Baker DOB: 05/31/1966 MRN: [id number]   Date of Service  02/15/2016  HPI/Events of Note  Hypokalemia persists with potassium 3.3.  eICU Interventions  1. KCl 40 mEq VT 1 2. KCl 10 mg IV x2 runs     Intervention Category Intermediate Interventions: Electrolyte abnormality - evaluation and management  Lawanda CousinsJennings Nestor 02/15/2016, 3:44 AM

## 2016-02-15 NOTE — Progress Notes (Signed)
Notified Pharmacy and spoke with Shanda BumpsJessica about patients ptt >200. Nothing changed at this tim,e, waiting on heparin level.  [name] [name] 1:37 PM

## 2016-02-15 NOTE — Progress Notes (Signed)
Family has requested w/d of care and transition to full comfort care. Her mother, son and sister were part of the conversation and unanimously agreed to this.  Plan Extubate Full comfort.   Simonne MartinetPeter E Babcock ACNP-BC [facility] Pulmonary/Critical Care Pager # [phone] OR # [phone] if no answer

## 2016-02-15 NOTE — Progress Notes (Signed)
Chaplain was in unit helping another patient family when I was asked to help this patient family.  Family was outside the room while patient was extubated, but returned shortly after.  Family members were very emotional....giving support to one another.  Chaplain led prayer and stayed at bedside for a few minutes.    Rev. TaborJan Hill, IowaChaplain [phone]

## 2016-02-15 NOTE — Progress Notes (Signed)
ANTICOAGULATION CONSULT NOTE - Follow Up Consult  Pharmacy Consult for Heparin  Indication: pulmonary embolus  No Known Allergies  Patient Measurements: Height: 5\' 1"  (154.9 cm) Weight: 138 lb 10.7 oz (62.9 kg) IBW/kg (Calculated) : 47.8  Vital Signs: Temp: 98.3 F (36.8 C) (04/02 0341) Temp Source: Oral (04/02 0341) BP: 117/55 mmHg (04/02 0348) Pulse Rate: 97 (04/02 0348)  Labs:  Recent Labs  02/13/16 0443  02/14/16 0515 02/15/16 0215 02/15/16 0420  HGB 9.4*  --  8.7*  --  8.5*  HCT 30.4*  --  29.6*  --  29.6*  PLT 228  --  215  --  170  APTT 129*  --   --   --   --   LABPROT 17.5*  --   --   --   --   INR 1.42  --   --   --   --   HEPARINUNFRC 0.53  --  0.68  --  0.85*  CREATININE 0.49  < > 0.56 0.38* 0.44  < > = values in this interval not displayed.  Estimated Creatinine Clearance: 73.8 mL/min (by C-G formula based on Cr of 0.44).   Assessment: Heparin for PE, s/p EKOS, HL is supra-therapeutic this AM, Hgb with slight trend down (monitor), no issues per RN.  Goal of Therapy:  Heparin level 0.3-0.7 units/ml Monitor platelets by anticoagulation protocol: Yes   Plan:  -Decrease heparin to 900 units/hr -1300 HL  Abran DukeLedford, Caelum Federici 02/15/2016,5:28 AM

## 2016-02-15 NOTE — Progress Notes (Signed)
ANTICOAGULATION CONSULT NOTE - Follow Up Consult  Pharmacy Consult for Heparin Indication: pulmonary embolus  No Known Allergies  Patient Measurements: Height: 5\' 1"  (154.9 cm) Weight: 132 lb 7.9 oz (60.1 kg) IBW/kg (Calculated) : 47.8 Heparin Dosing Weight: 62 kg  Vital Signs: Temp: 97.7 F (36.5 C) (04/02 1200) Temp Source: Oral (04/02 1200) BP: 111/57 mmHg (04/02 1200) Pulse Rate: 85 (04/02 1400)  Labs:  Recent Labs  02/13/16 0443  02/14/16 0515 02/15/16 0215 02/15/16 0420 02/15/16 1140 02/15/16 1335  HGB 9.4*  --  8.7*  --  8.5*  --   --   HCT 30.4*  --  29.6*  --  29.6*  --   --   PLT 228  --  215  --  170  --   --   APTT 129*  --   --   --   --  >200*  --   LABPROT 17.5*  --   --   --   --  17.4*  --   INR 1.42  --   --   --   --  1.42  --   HEPARINUNFRC 0.53  --  0.68  --  0.85*  --  0.90*  CREATININE 0.49  < > 0.56 0.38* 0.44  --   --   < > = values in this interval not displayed.  Estimated Creatinine Clearance: 72.3 mL/min (by C-G formula based on Cr of 0.44).   Assessment: [age] YOF [facility] with likely EtOH pancreatitis and ARDS for ventilator management. She was started on IV heparin for acute PE and is now s/p EKOS. Mobile clot in R-IJ (removing), R-subclavian and axillary DVT. Heparin level is now elevated. No bleeding noted.   Goal of Therapy:  Heparin level 0.3-0.7 units/ml Monitor platelets by anticoagulation protocol: Yes   Plan:  - Decrease heparin gtt to 700 units/hr  - Check an 8 hour heparin level - Daily heparin level and CBC  Lysle Pearlachel Taiwan Talcott, PharmD, BCPS Pager # [phone] 02/15/2016 2:00 PM

## 2016-02-15 NOTE — Progress Notes (Addendum)
Notified NP Pete about ptt >200.  Abigail Baker 1:56 PM

## 2016-02-15 NOTE — Progress Notes (Signed)
PULMONARY / CRITICAL CARE MEDICINE   Name: Abigail Baker MRN: [id number] DOB: 02/25/1966    ADMISSION DATE:  2016-10-28  CHIEF COMPLAINT:  Pancreatitis  HISTORY OF PRESENT ILLNESS:   50 yo F with h/o DM, Etoh abuse, who presented to [facility] on 01/29/2016 after several weeks of abdominal pain, n/v.  She was found to have most likely EtOH pancreatitis, she developed ARDS and reportedly b/l PNA for which she was treated with abx.  She also developed a pancreatic pseudocyst.  She was transferred to [facility] for 24/7 CCM and advanced ventilator management given bouts of hypoxemia despite high PEEP/FiO2  SUBJECTIVE:  DVt mobile rt ij this am  Some loose stools Hypokalemia from diuresis Neg 2.8 liters  VITAL SIGNS: Temp:  [97.3 F (36.3 C)-98.6 F (37 C)] 97.7 F (36.5 C) (04/02 1200) Pulse Rate:  [48-107] 85 (04/02 1400) Resp:  [17-29] 22 (04/02 1400) BP: (109-164)/(47-100) 111/57 mmHg (04/02 1200) SpO2:  [82 %-98 %] 91 % (04/02 1400) Arterial Line BP: (108-151)/(47-73) 111/53 mmHg (04/02 1200) FiO2 (%):  [70 %-90 %] 70 % (04/02 1115) Weight:  [132 lb 7.9 oz (60.1 kg)] 132 lb 7.9 oz (60.1 kg) (04/02 0500) HEMODYNAMICS: CVP:  [8 mmHg-15 mmHg] 11 mmHg VENTILATOR SETTINGS: Vent Mode:  [-] PCV FiO2 (%):  [70 %-90 %] 70 % Set Rate:  [24 bmp] 24 bmp PEEP:  [10 cmH20] 10 cmH20 Plateau Pressure:  [14 cmH20-18 cmH20] 18 cmH20 INTAKE / OUTPUT:  Intake/Output Summary (Last 24 hours) at 02/15/16 1438 Last data filed at 02/15/16 1358  Gross per 24 hour  Intake 3185.3 ml  Output   3350 ml  Net -164.7 ml    PHYSICAL EXAMINATION: General:  Sedated. No distress; still on high FIO2 Neuro:  Intubated and sedated. Pupils equal, rass -3 HEENT:  Endotracheal tube in place, jvd down Cardiovascular:  Regular rate. Anasarca improved Normal S1 & S2. Lungs: diffuse rhonchi; no accessory use  Abdomen:  Soft. Nondistended. BS wnl Integument:  Warm & Dry. No rash on exposed  skin.  LABS:  CBC  Recent Labs Lab 02/13/16 0443 02/14/16 0515 02/15/16 0420  WBC 24.1* 15.5* 12.6*  HGB 9.4* 8.7* 8.5*  HCT 30.4* 29.6* 29.6*  PLT 228 215 170   Coag's  Recent Labs Lab Jan 23, 2016 2305 02/12/16 0148 02/13/16 0443 02/15/16 1140  APTT 38*  --  129* >200*  INR 1.34 2.49* 1.42 1.42   BMET  Recent Labs Lab 02/14/16 0515 02/15/16 0215 02/15/16 0420  NA 145 148* 147*  K 2.8* 3.3* 4.1  CL 85* 87* 87*  CO2 >50* >50* >50*  BUN 5* 7 8  CREATININE 0.56 0.38* 0.44  GLUCOSE 287* 186* 269*   Electrolytes  Recent Labs Lab 02/13/16 0443  02/13/16 1830 02/14/16 0515 02/14/16 1739 02/15/16 0215 02/15/16 0420  CALCIUM 7.4*  < > 7.3* 7.4*  --  7.8* 7.7*  MG 2.2  --  1.9 1.8 2.2  --  1.9  PHOS 2.5  --  2.6 2.0*  --   --   --   < > = values in this interval not displayed. Sepsis Markers  Recent Labs Lab Jan 23, 2016 2305 02/11/16 0335 02/12/16 0148  LATICACIDVEN 1.6  --   --   PROCALCITON 0.14 0.36 0.44   ABG  Recent Labs Lab 02/13/16 0350 02/13/16 1421 02/14/16 0425  PHART 7.456* 7.386 7.496*  PCO2ART 62.0* 81.6* 69.1*  PO2ART 70.9* 134.0* 64.0*   Liver Enzymes  Recent Labs Lab 02/13/16 0443  02/14/16 0515 02/15/16 0420  AST 60* 51* 50*  ALT ALKPHOS 95 94 83  BILITOT 0.6 0.6 0.6  ALBUMIN 1.7* 1.8* 1.8*   Cardiac Enzymes  Recent Labs Lab 02/09/16 1940 02/10/16 0038 02/10/16 0634  TROPONINI 0.10* 0.09* 0.06*   Glucose  Recent Labs Lab 02/14/16 1607 02/14/16 2007 02/15/16 0022 02/15/16 0340 02/15/16 0816 02/15/16 1206  GLUCAP 267* 145* 115* 202* 250* 280*    Imaging No results found. EVENTS: 3/16 - Admit to OSH [facility]) 3/19 - Worsening hypoxia & altered mentation. Transferred to ICU. Failed trial of BiPAP & was intubated. 3/26 - Transfer to [facility] 3/28 PE noted, EKOS perfromed  STUDIES: CT ABD/PELVIS OSH 3/23:  Subacute pancreatitis w/ 6.9x3.6cm pseudocyst in lesser sac & 7mm pseudocyst in tail.  Moderate bilateral pleural effusions.  Port CXR 3/26:  Personally reviewed by me. R IJ at cavoatrial junction. ETT 2cm above carina. Bilateral opacities. CTA Chest 3/27:  Moderate bilateral pleural effusions. Diffuse lung opacities. Small PE left lower lobe branch & large central PE right pulmonary artery w/ RV/LV ratio 1.1. KUB 3/27:  Feeding tube in stomach. Doppler all ext>>>The right internal jugular vein exhibits mobile acute deep vein thrombosis.  There is acute occlusive deep vein thrombosis involving the right subclavian and right axillary veins.  There is acute, nonocclusive deep vein thrombosis involving a single right brachial vein; the other brachial vein is patent and compressible.  The right cephalic vein exhibits acute occlusive superficial vein thrombosis.  acute occlusive superficial vein thrombosis involving the entire visualized portion of the left cephalic vein, including around the IV  MICROBIOLOGY: MRSA PCR 3/26:  Negative  3/27- candidia  ANTIBIOTICS: None.  LINES/TUBES: OETT [place] 3/19>>> R IJ CVL 3/19 or 3/20>>>dc 3/31 as IJ clot Left IJ planned 3/31>>> OGT>>> Foley>>> R Radial Art Line 3/27>>>  ASSESSMENT / PLAN:  PULMONARY A: Bilateral PE - Large R Central PE. ARDS Bilateral Pleural Effusions Tobacco Use Does NOT tolerate APRV -aeration a little better. Still w/ high FIO2 needs  P:   Cont Hep drip Go back to peep 12 to get FIO2 </=60 pcxr in am  Continued neg balance  CARDIOVASCULAR A:  Shock apparently looking like sedation related Anasarca CLOT associated with rt IJ , line, mobile AI P:  Neo-Synephrine to maintain MAP>>60 stress steroids and added flroinef  RENAL A:   NonAGMA AGMA - Resolved. 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 S/P previous transfusion for Hgb of 8. Leukocytosis - Increasing.  Extensive DVT of the RUE and superficial thrombus on left  coagulapthy sepsis? Consumption from clot  P:  Heparin drip per pharmacy protocol, elected to NOT give ffp, vit k or hold heparin for trach  Likely needs trach next week , but reluctant now to hold anticoagulation with recent PE and now mobile clot IJ Will d/c left IJ and place femoral for now   INFECTIOUS A:   Fever 3/28 On multiple antibiotics previously-->abx stopped.  P:   trend fever curve   ENDOCRINE A:   H/O DM Type I R/o rel AI P:   Accu-Checks q4hr  SSI  per algorithm Add scheduled short acting insulin  Increase lantus to 25  NEUROLOGIC A:   Sedation on Ventilator H/O EtOH Use  P:   RASS goal: 0 to -1 Fentanly gtt & IV prn Versed gtt & IV prn Thiamine & FA IV daily WUA daily Hope to dc gtts after trach   Still requiring high FIO2. Pressor requirement are improving. Issues are failure to wean, acute DVT and left superficial clot on left. Both IJs out. Looks like will need trach early next week. Will try to convert back to IV bolus of lasix. I am concerned that her prognosis for any meaningful functional recovery is poor.    Simonne Martinet ACNP-BC [facility] Pulmonary/Critical Care Pager # [phone] OR # [phone] if no answer  Attending Note:  50 year old female with pancreatitis and subsequent ARDS, HCAP and septic shock. On exam, diffuse crackles. I reviewed CXR myself, ARDS pattern noted, ETT ok. Will continue lasix drip. Monitor of abx. Sedation as ordered. Family updated by NP. Discussed with PCCM-NP.  Spoke with mother extensively, she is leaning more towards comfort care, she does not think patient would want to put herself through a trach/peg and SNF kind  of existence.  Spoke with mother, wishes for DNR status but sister is the DPOA and is arriving soon.  Will likely make a LCB with no CPR and no cardioversion.  The patient is critically ill with multiple organ systems failure and requires high complexity decision making for assessment and support, frequent evaluation and titration of therapies, application of advanced monitoring technologies and extensive interpretation of multiple databases.   Critical Care Time devoted to patient care services described in this note is 35 Minutes. This time reflects time of care of this signee Dr Koren Bound. This critical care time does not reflect procedure time, or teaching time or supervisory time of PA/NP/Med student/Med Resident etc but could involve care discussion time.  Alyson Reedy, M.D. [facility] Pulmonary/Critical Care Medicine. Pager: [phone]. After hours pager: [phone].

## 2016-02-15 NOTE — Progress Notes (Signed)
Present at family meeting, decided to go to comfort measures. Heparin and Lasix d/c. Pt extubated at 1755. Pt on 6L O2 [place] sating at 52. Pt appears comfortable at 8mg  Versed and 400mcg of Fentanyl. Will continue to monitor patients comfort level. Emotional support provided to family. Chaplain at bedside.  Horton ChinMacKayla A Norton Bivins RN 6:17 PM

## 2016-02-18 ENCOUNTER — Telehealth: Payer: Self-pay

## 2016-02-18 NOTE — Telephone Encounter (Signed)
On 02/18/2016 I received a death certificate from [facility] (faxed). The death certificate is for cremation. The patient is a patient of Doctor Dios. The death certificate will be taken to [place] ([address]) this pm for signature.  On 02/19/2016 I received the death certificate back from Doctor Dios. I got the death certificate ready and called the funeral home to let them know I faxed the death certificate over to the funeral home per their request.

## 2016-02-25 ENCOUNTER — Telehealth: Payer: Self-pay

## 2016-02-25 NOTE — Telephone Encounter (Signed)
On 02/25/2016 I received a death certificate from [facility] (original). The death certificate is for cremation. The patient is a patient of Doctor Dios. The death certificate will be taken to [facility] this am for signature. On 02/25/2016 I received the death certificate back from Doctor Dios. I got the death certificate ready and called the funeral home to let them know I [facility] the [facility] per [facility].

## 2016-03-15 NOTE — Progress Notes (Signed)
Patient passed away at 06:52 this morning; verified by monitor showing asystole, auscultated by primary nurse with no heart sounds, no lung sounds, both pupils fixed and dilated and no pulses throughout.  Family at bedside and notified of the time of death. Findings also verified and confirmed with on-coming nurse, Meredith StaggersLinda Bass, RN.

## 2016-03-15 NOTE — Discharge Summary (Signed)
PULMONARY / CRITICAL CARE MEDICINE   Name: Abigail Baker MRN: [id number] DOB: 05/29/1966    ADMISSION DATE:  02/13/2016  CHIEF COMPLAINT:  Pancreatitis  HISTORY OF PRESENT ILLNESS:   50 yo F with h/o DM, Etoh abuse, who presented to [facility] on 01/29/2016 after several weeks of abdominal pain, n/v.  She was found to have most likely EtOH pancreatitis, she developed ARDS and reportedly b/l PNA for which she was treated with abx.  She also developed a pancreatic pseudocyst.  She was transferred to [facility] hospital for 24/7 CCM and advanced ventilator management given bouts of hypoxemia despite high PEEP/FiO2  PAST MEDICAL HISTORY :   has no past medical history on file.  has no past surgical history on file. Prior to Admission medications   Not on File   No Known Allergies  FAMILY HISTORY:  has no family status information on file.  SOCIAL HISTORY:    REVIEW OF SYSTEMS:  Unable to obtain  SUBJECTIVE:   VITAL SIGNS: Temp:  [98.1 F (36.7 C)] 98.1 F (36.7 C) (04/02 1609) Pulse Rate:  [54-96] 54 (04/03 0600) Resp:  [0-26] 0 (04/03 0652) BP: (58-124)/(37-71) 58/37 mmHg (04/03 0600) SpO2:  [36 %-50 %] 49 % (04/03 0600) Arterial Line BP: (48-141)/(24-68) 48/24 mmHg (04/03 0600) FiO2 (%):  [70 %] 70 % (04/02 1600) HEMODYNAMICS:   VENTILATOR SETTINGS: Vent Mode:  [-]  FiO2 (%):  [70 %] 70 % INTAKE / OUTPUT:  Intake/Output Summary (Last 24 hours) at 02-24-16 1558 Last data filed at February 24, 2016 4098  Gross per 24 hour  Intake  975.6 ml  Output    100 ml  Net  875.6 ml    PHYSICAL EXAMINATION: General:  NAD Neuro:  Intubated and sedated,  Opens Eyes to voice but does not follow commands.  HEENT:  ETT in place, conjunctival edema Cardiovascular:  RRR, s1/s2 no m/r/g Lungs:  Course breath sounds b/l, no w/r/r Abdomen:  Soft, not TTP, no bowel sounds. No rebound or guarding Musculoskeletal:  Normal bulk and tone Skin:  + Anasarca.  No c/c  LABS:  CBC  Recent Labs Lab  02/13/16 0443 02/14/16 0515 02/15/16 0420  WBC 24.1* 15.5* 12.6*  HGB 9.4* 8.7* 8.5*  HCT 30.4* 29.6* 29.6*  PLT 228 215 170   Coag's  Recent Labs Lab 02/12/16 0148 02/13/16 0443 02/15/16 1140  APTT  --  129* >200*  INR 2.49* 1.42 1.42   BMET  Recent Labs Lab 02/14/16 0515 02/15/16 0215 02/15/16 0420  NA 145 148* 147*  K 2.8* 3.3* 4.1  CL 85* 87* 87*  CO2 >50* >50* >50*  BUN 5* 7 8  CREATININE 0.56 0.38* 0.44  GLUCOSE 287* 186* 269*   Electrolytes  Recent Labs Lab 02/13/16 0443  02/13/16 1830 02/14/16 0515 02/14/16 1739 02/15/16 0215 02/15/16 0420 02/15/16 1715  CALCIUM 7.4*  < > 7.3* 7.4*  --  7.8* 7.7*  --   MG 2.2  --  1.9 1.8 2.2  --  1.9 2.0  PHOS 2.5  --  2.6 2.0*  --   --   --   --   < > = values in this interval not displayed. Sepsis Markers  Recent Labs Lab 02/11/16 0335 02/12/16 0148  PROCALCITON 0.36 0.44   ABG  Recent Labs Lab 02/13/16 0350 02/13/16 1421 02/14/16 0425  PHART 7.456* 7.386 7.496*  PCO2ART 62.0* 81.6* 69.1*  PO2ART 70.9* 134.0* 64.0*   Liver Enzymes  Recent Labs Lab 02/13/16 0443 02/14/16  0515 02/15/16 0420  AST 60* 51* 50*  ALT 27 25 24   ALKPHOS 95 94 83  BILITOT 0.6 0.6 0.6  ALBUMIN 1.7* 1.8* 1.8*   Cardiac Enzymes  Recent Labs Lab 02/09/16 1940 02/10/16 0038 02/10/16 0634  TROPONINI 0.10* 0.09* 0.06*   Glucose  Recent Labs Lab 02/14/16 2007 02/15/16 0022 02/15/16 0340 02/15/16 0816 02/15/16 1206 02/15/16 1608  GLUCAP 145* 115* 202* 250* 280* 189*    Imaging No results found.   ASSESSMENT / PLAN: 50 yo with acute EtOH pancreatitis and resulting ARDS with anasarca and prolonged hospital course.  PULMONARY A: ARDS Intubated 3/16 B/L pleural effusion  P:   - Low TV ventilation/ARDS protocol - Plat <30 - Permissive hypercapnia pH >7.2 - ABG now - currently satting 95-100% - Goal O2 88-95% - Diuresis goal net negative 1-2L as BP tolerates - Fentanyl gtt and versed pushes  for sedation - Oral care - PPI  CARDIOVASCULAR A:  Not active P:   RENAL A:   AGMA - corrected AG = 19 P:   - Lactate negative - normal BUN - check UA for ketones - monitor AG  GASTROINTESTINAL A:   Acute EtOH pancreatitis Pancreatic pseudocyst x2 per OSH CT abdomen report  P:   - start tube feeding  - continue supportive care  HEMATOLOGIC A:   Anemia - no prior H/H Thrombocytopenia - likely EtOH S/p transfusion of PRBC for Hb of 8 P:  - no further transfusions - transfuse for Hb <7   INFECTIOUS A:   B/L PNA reported at OSH - s/p 10 days abx, unclear if this was real or simple severe ARDS P:   - holding ABX for now - check procalcitonin  ENDOCRINE A:   DM   P:   - SSI - add basal when Tube feeds are started - check Lipid panel  NEUROLOGIC A:   Sedated  P:   RASS goal: 0 -Fentanyl gtt - weaning this down now - Versed PRN (trying to minimize)    Total critical care time: 45 min  Critical care time was exclusive of separately billable procedures and treating other patients.  Critical care was necessary to treat or prevent imminent or life-threatening deterioration.  Critical care was time spent personally by me on the following activities: development of treatment plan with patient and/or surrogate as well as nursing, discussions with consultants, evaluation of patient's response to treatment, examination of patient, obtaining history from patient or surrogate, ordering and performing treatments and interventions, ordering and review of laboratory studies, ordering and review of radiographic studies, pulse oximetry and re-evaluation of patient's condition.   Galvin Profferaniel LoVerde, DO., MS [place] [facility]    Pulmonary and Critical Care Medicine [facility] Pager: [phone]  03/13/2016, 3:58 PM  Pt had a compliacted ICU course. Pls see last progress note.  Name:Abigail  Madilyn Baker GNF:[id number] DOB:08/23/1966   ADMISSION DATE: 01/23/2016  CHIEF COMPLAINT: Pancreatitis  HISTORY OF PRESENT ILLNESS:  50 yo F with h/o DM, Etoh abuse, who presented to [facility] on 01/29/2016 after several weeks of abdominal pain, n/v. She was found to have most likely EtOH pancreatitis, she developed ARDS and reportedly b/l PNA for which she was treated with abx. She also developed a pancreatic pseudocyst. She was [facility] to [facility] hospital for 24/7 CCM and advanced ventilator management given bouts of hypoxemia despite high PEEP/FiO2  SUBJECTIVE:  DVt mobile rt ij this am  Some loose stools Hypokalemia from diuresis Neg  2.8 liters  VITAL SIGNS: Temp: [97.3 F (36.3 C)-98.6 F (37 C)] 97.7 F (36.5 C) (04/02 1200) Pulse Rate: [48-107] 85 (04/02 1400) Resp: [17-29] 22 (04/02 1400) BP: (109-164)/(47-100) 111/57 mmHg (04/02 1200) SpO2: [82 %-98 %] 91 % (04/02 1400) Arterial Line BP: (108-151)/(47-73) 111/53 mmHg (04/02 1200) FiO2 (%): [70 %-90 %] 70 % (04/02 1115) Weight: [132 lb 7.9 oz (60.1 kg)] 132 lb 7.9 oz (60.1 kg) (04/02 0500) HEMODYNAMICS: CVP: [8 mmHg-15 mmHg] 11 mmHg VENTILATOR SETTINGS: Vent Mode: [-] PCV FiO2 (%): [70 %-90 %] 70 % Set Rate: [24 bmp] 24 bmp PEEP: [10 cmH20] 10 cmH20 Plateau Pressure: [14 cmH20-18 cmH20] 18 cmH20 INTAKE / OUTPUT:  Intake/Output Summary (Last 24 hours) at 02/15/16 1438 Last data filed at 02/15/16 1358  Gross per 24 hour  Intake 3185.3 ml  Output  3350 ml  Net -164.7 ml    PHYSICAL EXAMINATION: General: Sedated. No distress; still on high FIO2 Neuro: Intubated and sedated. Pupils equal, rass -3 HEENT: Endotracheal tube in place, jvd down Cardiovascular: Regular rate. Anasarca improved Normal S1 & S2. Lungs: diffuse rhonchi; no accessory use  Abdomen: Soft. Nondistended. BS wnl Integument: Warm & Dry. No rash on exposed skin.  LABS:  CBC  Last Labs       Recent Labs Lab 02/13/16 0443 02/14/16 0515 02/15/16 0420  WBC 24.1* 15.5* 12.6*  HGB 9.4* 8.7* 8.5*  HCT 30.4* 29.6* 29.6*  PLT 228 215 170     Coag's  Last Labs      Recent Labs Lab February 16, 2016 2305 02/12/16 0148 02/13/16 0443 02/15/16 1140  APTT 38* --  129* >200*  INR 1.34 2.49* 1.42 1.42     BMET  Last Labs      Recent Labs Lab 02/14/16 0515 02/15/16 0215 02/15/16 0420  NA 145 148* 147*  K 2.8* 3.3* 4.1  CL 85* 87* 87*  CO2 >50* >50* >50*  BUN 5* 7 8  CREATININE 0.56 0.38* 0.44  GLUCOSE 287* 186* 269*     Electrolytes  Last Labs      Recent Labs Lab 02/13/16 0443  02/13/16 1830 02/14/16 0515 02/14/16 1739 02/15/16 0215 02/15/16 0420  CALCIUM 7.4* < > 7.3* 7.4* --  7.8* 7.7*  MG 2.2 --  1.9 1.8 2.2 --  1.9  PHOS 2.5 --  2.6 2.0* --  --  --   < > = values in this interval not displayed.   Sepsis Markers  Last Labs      Recent Labs Lab 02/20/2016 2305 02/11/16 0335 02/12/16 0148  LATICACIDVEN 1.6 --  --   PROCALCITON 0.14 0.36 0.44     ABG  Last Labs      Recent Labs Lab 02/13/16 0350 02/13/16 1421 02/14/16 0425  PHART 7.456* 7.386 7.496*  PCO2ART 62.0* 81.6* 69.1*  PO2ART 70.9* 134.0* 64.0*     Liver Enzymes  Last Labs      Recent Labs Lab 02/13/16 0443 02/14/16 0515 02/15/16 0420  AST 60* 51* 50*  ALT ALKPHOS 95 94 83  BILITOT 0.6 0.6 0.6  ALBUMIN 1.7* 1.8* 1.8*     Cardiac Enzymes  Last Labs      Recent Labs Lab 02/09/16 1940 02/10/16 0038 02/10/16 0634  TROPONINI 0.10* 0.09* 0.06*     Glucose  Last Labs      Recent Labs Lab 02/14/16 1607 02/14/16 2007 02/15/16 0022 02/15/16 0340 02/15/16 0816 02/15/16 1206  GLUCAP 267* 145* 115* 202* 250* 280*  Imaging No results found. EVENTS: 3/16 - Admit to  [facility]) 3/19 - Worsening hypoxia & altered mentation. Transferred to ICU. Failed trial of BiPAP & was intubated. 3/26 - Transfer to [facility] 3/28 PE noted, EKOS perfromed  STUDIES: CT ABD/PELVIS OSH 3/23: Subacute pancreatitis w/ 6.9x3.6cm pseudocyst in lesser sac & 7mm pseudocyst in tail. Moderate bilateral pleural effusions.  Port CXR 3/26: Personally reviewed by me. R IJ at cavoatrial junction. ETT 2cm above carina. Bilateral opacities. CTA Chest 3/27: Moderate bilateral pleural effusions. Diffuse lung opacities. Small PE left lower lobe branch & large central PE right pulmonary artery w/ RV/LV ratio 1.1. KUB 3/27: Feeding tube in stomach. Doppler all ext>>>The right internal jugular vein exhibits mobile acute deep vein thrombosis.  There is acute occlusive deep vein thrombosis involving the right subclavian and right axillary veins.  There is acute, nonocclusive deep vein thrombosis involving a single right brachial vein; the other brachial vein is patent and compressible.  The right cephalic vein exhibits acute occlusive superficial vein thrombosis.  acute occlusive superficial vein thrombosis involving the entire visualized portion of the left cephalic vein, including around the IV  MICROBIOLOGY: MRSA PCR 3/26: Negative  3/27- candidia  ANTIBIOTICS: None.  LINES/TUBES: OETT [place] 3/19>>> R IJ CVL 3/19 or 3/20>>>dc 3/31 as IJ clot Left IJ planned 3/31>>> OGT>>> Foley>>> R Radial Art Line 3/27>>>  ASSESSMENT / PLAN:  PULMONARY A: Bilateral PE - Large R Central PE. ARDS Bilateral Pleural Effusions Tobacco Use Does NOT tolerate APRV -aeration a little better. Still w/ high FIO2 needs  P:  Cont Hep drip Go back to peep 12 to get FIO2 </=60 pcxr in am  Continued neg balance  CARDIOVASCULAR A:  Shock apparently looking like sedation related Anasarca CLOT associated with rt IJ , line, mobile AI P:  Neo-Synephrine to maintain MAP>>60 stress  steroids and added flroinef  RENAL A:  NonAGMA AGMA - Resolved. Profound hypokalemia from lasix drip  ARDS Contraction alkalosis  Mild hypernatremia  P:  Dc lasix gtt  Replace KCL kvo Bmet q12h  GASTROINTESTINAL A:  Acute EtOH Pancreatitis Pancreatic Pseudocysts - x2 per OSH CT. Severe Protein-Calorie Malnutrition At risk hemorrhagic conversion pacrease P:  Protonix IV q24hr TF tolerated  HEMATOLOGIC A:  PE Anemia - No signs of active bleeding. S/P previous transfusion for Hgb of 8. Leukocytosis - Increasing.  Extensive DVT of the RUE and superficial thrombus on left  coagulapthy sepsis? Consumption from clot  P:  Heparin drip per pharmacy protocol, elected to NOT give ffp, vit k or hold heparin for trach  Likely needs trach next week , but reluctant now to hold anticoagulation with recent PE and now mobile clot IJ Will d/c left IJ and place femoral for now   INFECTIOUS A:  Fever 3/28 On multiple antibiotics previously-->abx stopped.  P:  trend fever curve   ENDOCRINE A:  H/O DM Type I R/o rel AI P:  Accu-Checks q4hr  SSI per algorithm Add scheduled short acting insulin  Increase lantus to 25  NEUROLOGIC A:  Sedation on Ventilator H/O EtOH Use  P:  RASS goal: 0 to -1 Fentanly gtt & IV prn Versed gtt & IV prn Thiamine & FA IV daily WUA daily Hope to dc gtts after trach   Still requiring high FIO2. Pressor requirement are improving. Issues are failure to wean, acute DVT and left superficial clot on left. Both IJs out. Looks like will need trach early next week. Will try to convert back to  IV bolus of lasix. I am concerned that her prognosis for any meaningful functional recovery is poor.    Simonne Martinet ACNP-BC [facility] Pulmonary/Critical Care Pager # [phone] OR # [phone] if no answer  Attending Note:  50 year old female with pancreatitis and subsequent ARDS, HCAP and septic shock. On exam, diffuse  crackles. I reviewed CXR myself, ARDS pattern noted, ETT ok. Will continue lasix drip. Monitor of abx. Sedation as ordered. Family updated by NP. Discussed with PCCM-NP. Spoke with mother extensively, she is leaning more towards comfort care, she does not think patient would want to put herself through a trach/peg and SNF kind of existence. Spoke with mother, wishes for DNR status but sister is the DPOA and is arriving soon. Will likely make a LCB with no CPR and no cardioversion.  The patient is critically ill with multiple organ systems failure and requires high complexity decision making for assessment and support, frequent evaluation and titration of therapies, application of advanced monitoring technologies and extensive interpretation of multiple databases.   Critical Care Time devoted to patient care services described in this note is 35 Minutes. This time reflects time of care of this signee Dr Koren Bound. This critical care time does not reflect procedure time, or teaching time or supervisory time of PA/NP/Med student/Med Resident etc but could involve care discussion time.  Alyson Reedy, M.D. [facility] Pulmonary/Critical Care Medicine. Pager: [phone]. After hours pager: [phone].    Family has requested w/d of care and transition to full comfort care. Her mother, son and sister were part of the conversation and unanimously agreed to this.  Plan Extubate Full comfort.   Simonne Martinet ACNP-BC [facility] Pulmonary/Critical Care Pager # [phone] OR # [phone] if no answer  Pt passed at 6:52am on 02/25/2016. Family updated by RN.    Pollie Meyer, MD February 25, 2016, 4:00 PM [place] [facility] and Critical [facility] Pager [phone] After 3 pm or if no answer, call [phone]

## 2016-03-15 NOTE — Progress Notes (Signed)
Wasted 15ml of versed drip with another nurse, Meredith StaggersLinda Bass.

## 2016-03-15 DEATH — deceased

## 2017-05-11 IMAGING — CR DG CHEST 1V PORT
1 series · 1 of 1 positions shown · non-contrast
Comparison: Chest x-ray dated 02/08/2016.

CLINICAL DATA: Acute respiratory failure with hypoxia.

EXAM:
PORTABLE CHEST 1 VIEW

[AP]
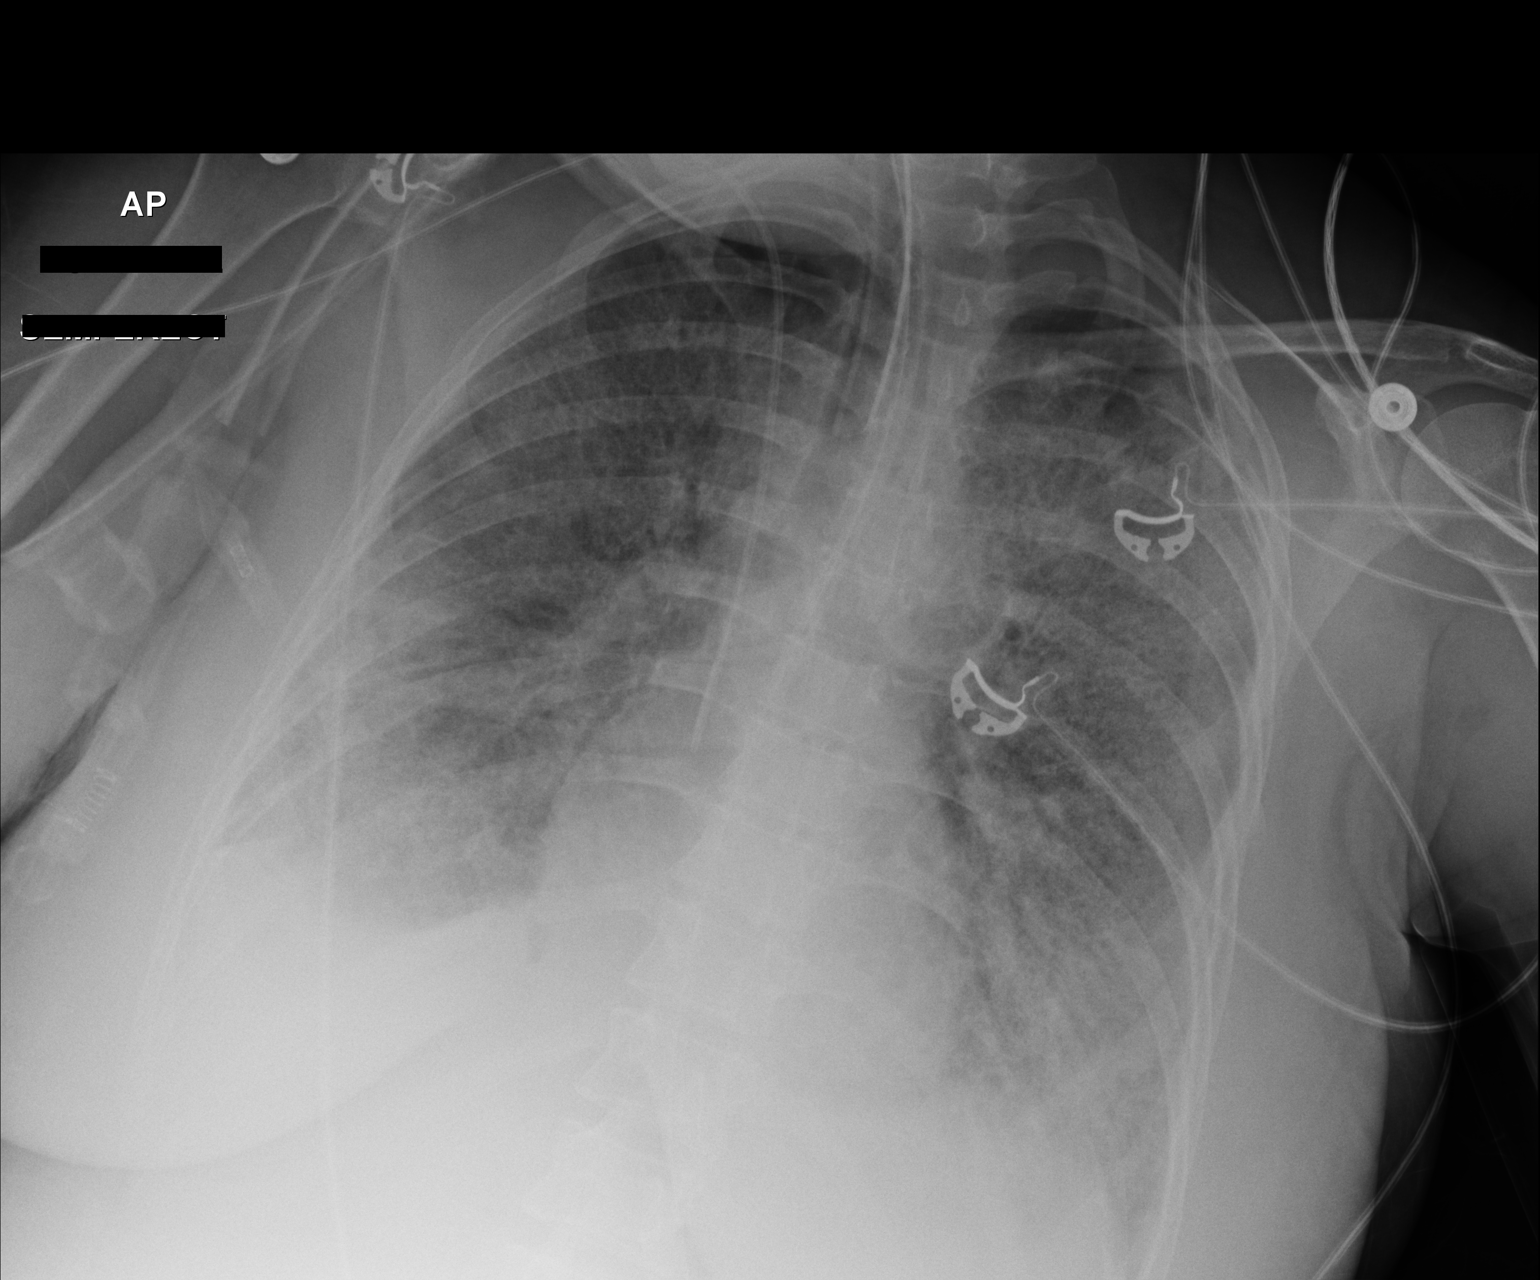

[1 of 1 positions shown; findings below may reference images not displayed]

FINDINGS: Endotracheal tube remains well positioned with tip just above the
level of the carina. Enteric tube passes below the diaphragm. Right
IJ central line is well positioned with tip at the level of the
lower SVC.

Heart size is normal. Overall cardiomediastinal silhouette is stable
in size and configuration. The diffuse bilateral pulmonary edema
pattern is stable, perhaps slightly improved aeration bilaterally.
Dense opacities at each lung base are unchanged, most likely
atelectasis and/or small effusions.
IMPRESSION: Stable exam with diffuse bilateral pulmonary edema pattern versus
ARDS, perhaps mildly improved aeration bilaterally. Persistent
bibasilar atelectasis and/or small effusions. Tubes and lines remain
well-positioned.

A left lateral rib fracture was suspected on yesterday's exam,
better seen on the previous chest x-ray.

## 2017-05-12 IMAGING — CR DG ABD PORTABLE 1V
1 series · 1 of 1 positions shown · non-contrast
Comparison: 02/09/2016

CLINICAL DATA: Feeding tube placement.

EXAM:
PORTABLE ABDOMEN - 1 VIEW

[AP]
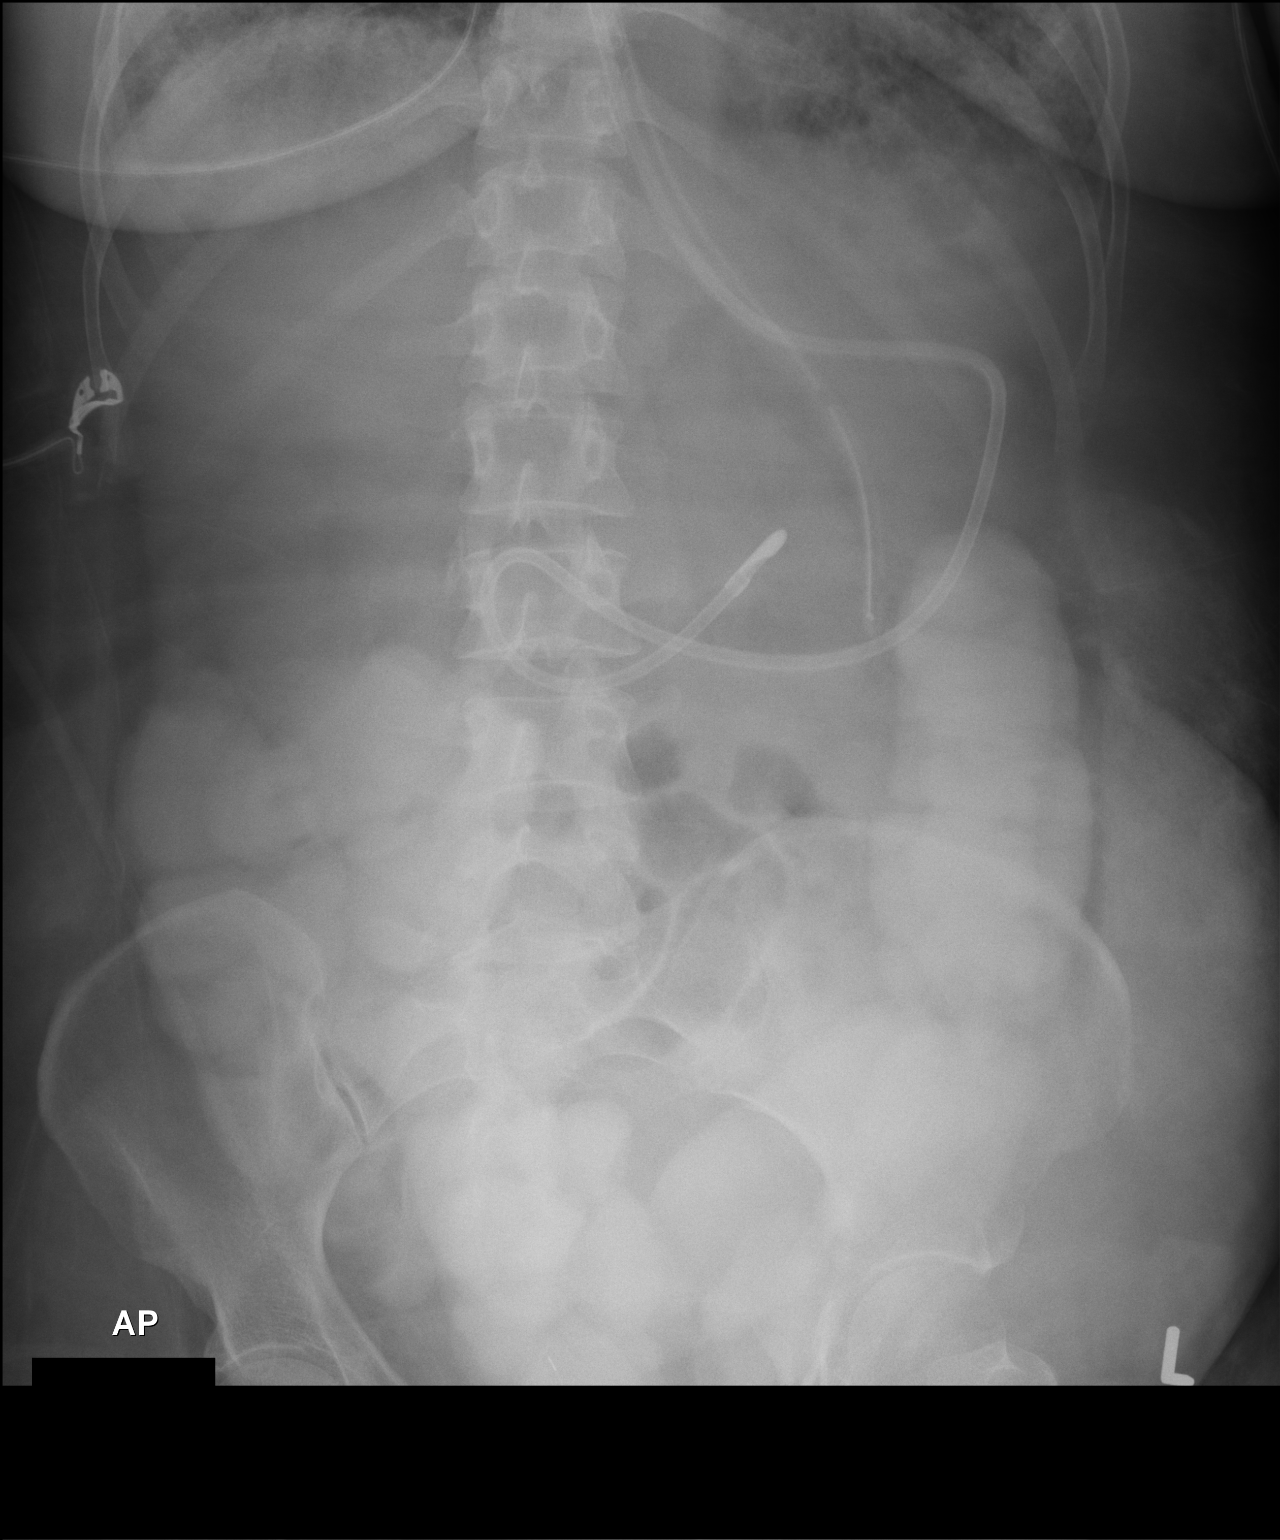

[1 of 1 positions shown; findings below may reference images not displayed]

FINDINGS: There is normal small bowel gas pattern. NG tube in place with tip
in mid stomach. There is a NG feeding tube with tip in distal
stomach or proximal duodenum.
IMPRESSION: NG tube in place with tip in mid stomach. There is a NG feeding tube
with tip in distal stomach or proximal duodenum.

## 2017-05-12 IMAGING — XA IR ANGIO/ADD [PERSON_NAME]
3 series · 12 of 12 positions shown · non-contrast
Comparison: CT chest 02/09/2016

INDICATION: 49-year-old female with alcoholic pancreatitis and sudden onset
respiratory failure at an outside hospital. CT imaging confirms
large volume right-sided pulmonary embolus with evidence right heart
strain. Additionally, she has significant airspace disease
bilaterally and bilateral layering pleural effusions. She presents
to interventional radiology for potential right-sided thoracentesis
if there is sufficient fluid as well as right-sided PE lysis.

EXAM:
IR INFUSION THROMBOL VENOUS INITIAL (MS); IR ULTRASOUND GUIDANCE
VASC ACCESS RIGHT; RIGHT PULMONARY ARTERIOGRAPHY; ADDITIONAL
ARTERIOGRAPHY
TECHNIQUE: Informed written consent was obtained from the patient after a
thorough discussion of the procedural risks, benefits and
alternatives. All questions were addressed. Maximal Sterile Barrier
Technique was utilized including caps, mask, sterile gowns, sterile
gloves, sterile drape, hand hygiene and skin antiseptic. A timeout
was performed prior to the initiation of the procedure.

[Series 1: body 4 care · 4 of 4 slices shown (1 of 2)]
[im 1/4]
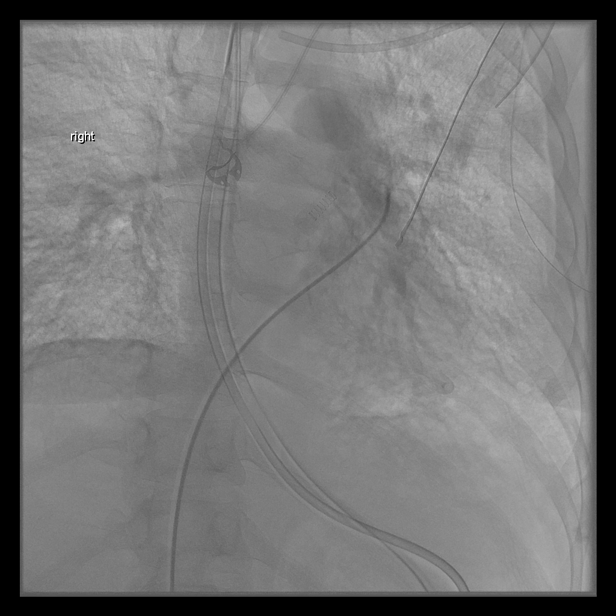
[im 2/4]
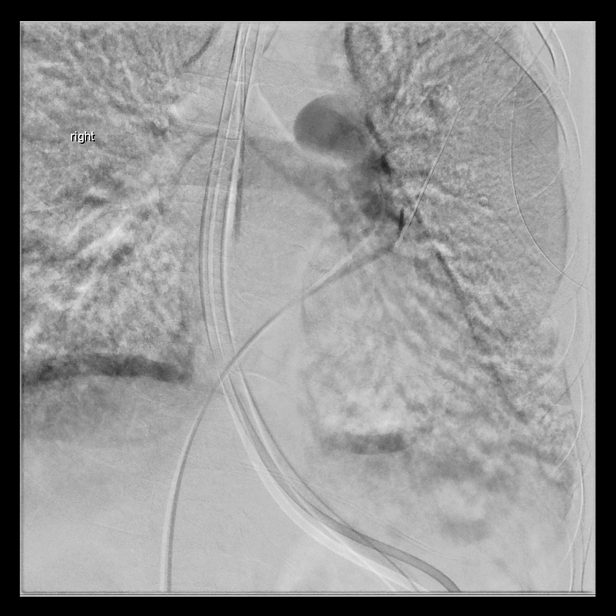
[im 3/4]
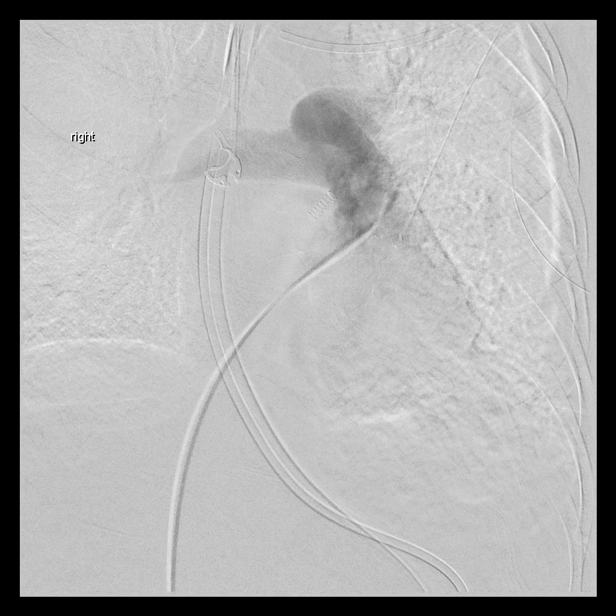
[im 4/4]
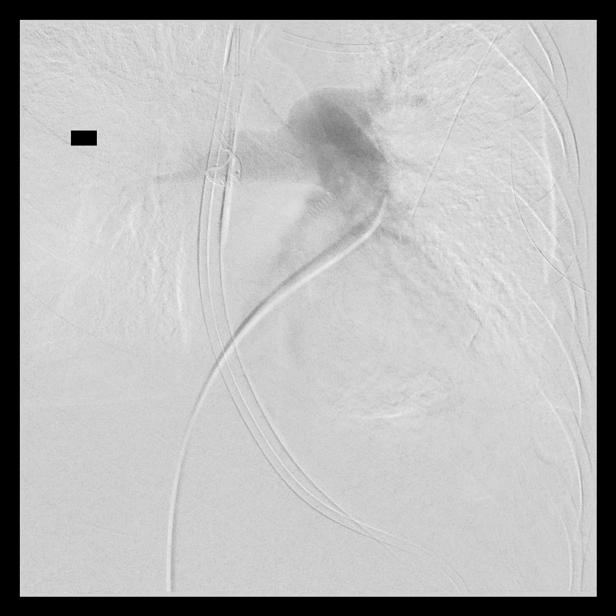

[Series 2: body 4 care · 7 of 7 slices shown (2 of 2)]
[im 1/7]
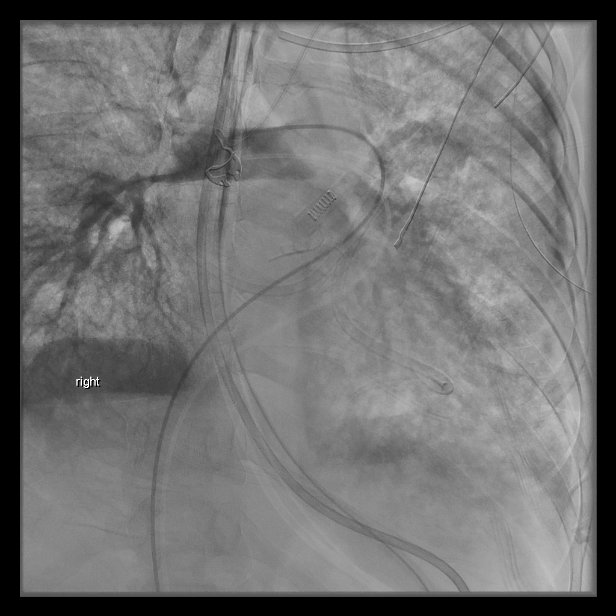
[im 2/7]
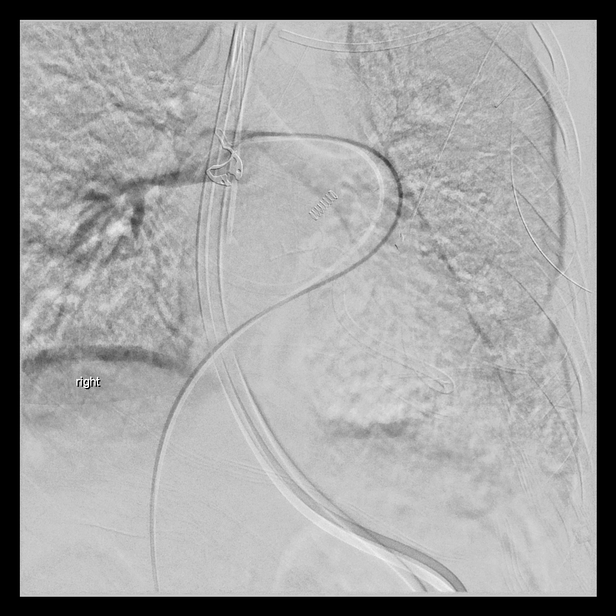
[im 3/7]
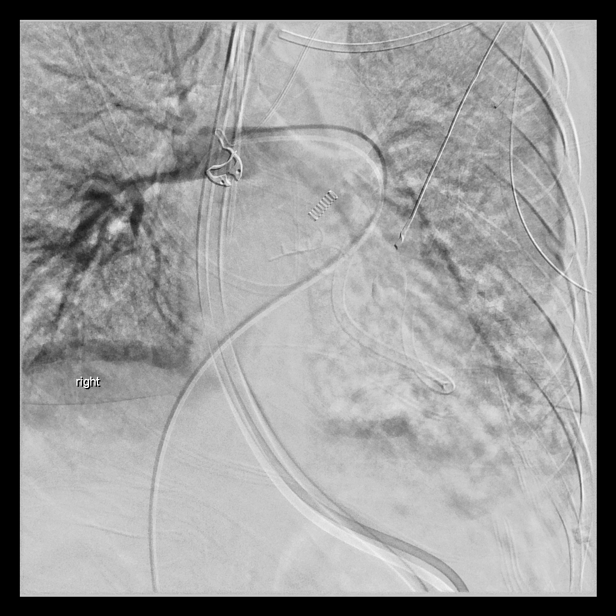
[im 4/7]
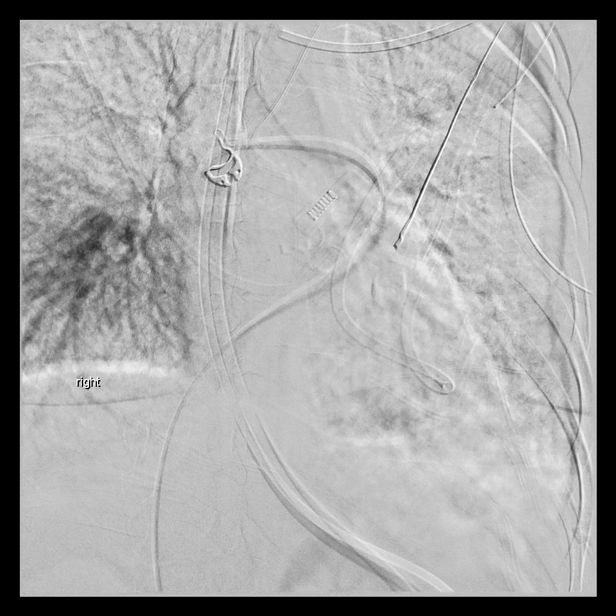
[im 5/7]
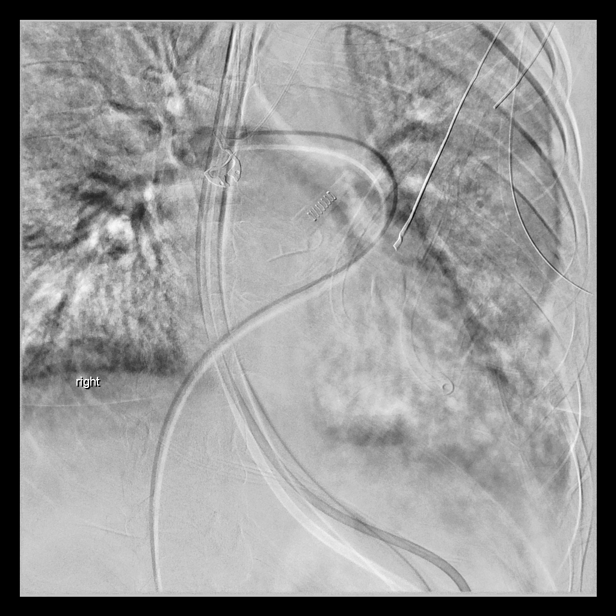
[im 6/7]
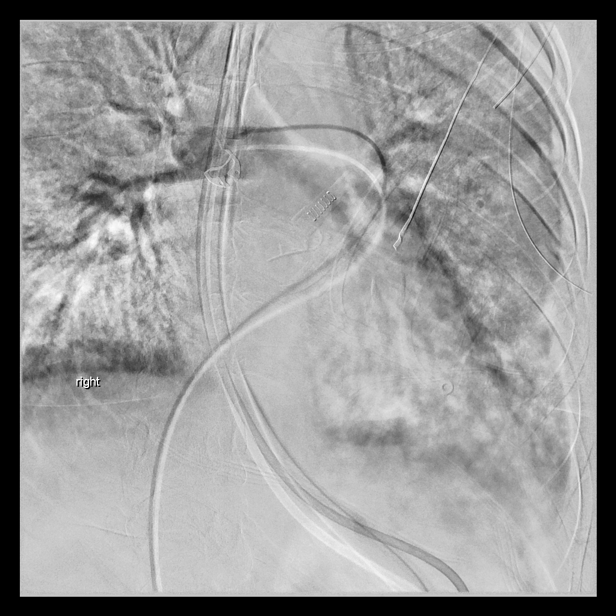
[im 7/7]
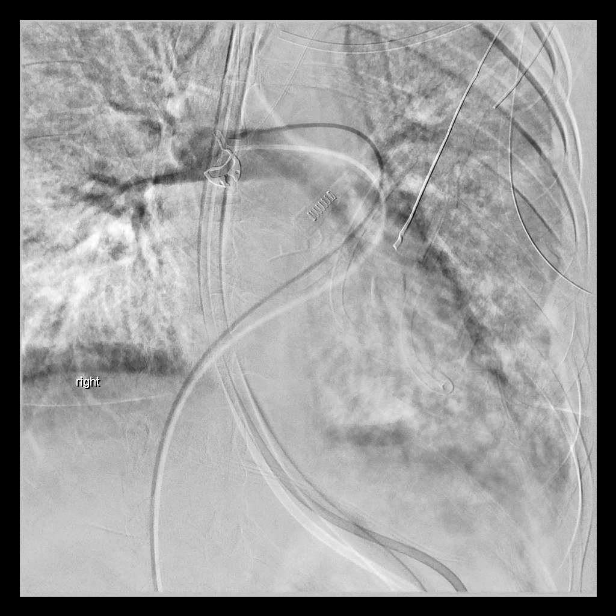

[Series 3: fl (-) angio · 1 of 1 slices shown]
[im 1/1]
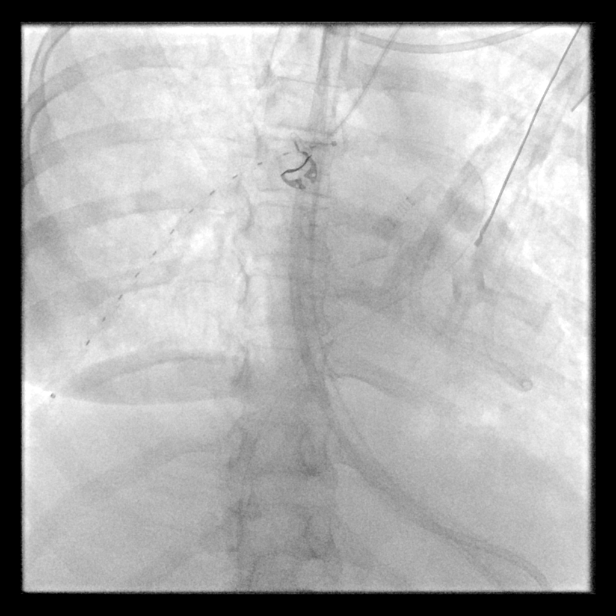

[12 of 12 positions shown; findings below may reference images not displayed]

MEDICATIONS:
None.

ANESTHESIA/SEDATION:
Versed 1 mg IV; Fentanyl 50 mcg IV

Moderate Sedation Time:  30

The patient was continuously monitored during the procedure by the
interventional radiology nurse under my direct supervision.

FLUOROSCOPY TIME:  Fluoroscopy Time: 8 minutes 24 seconds (28 mGy).

COMPLICATIONS:
None immediate.
The right chest was interrogated with ultrasound. There is only
trace pleural fluid. No safe window from a lateral approach for
thoracentesis.

Attention was turned to the right groin. The right groin was
interrogated with ultrasound. The common femoral vein is patent.
There is no evidence of thrombus. Image was obtained and stored for
the medical record. Using real-time sonographic guidance, the
vessels punctured with a 21 gauge micropuncture needle. Using
standard technique, the initial micro wire was exchanged through a
transitional 5 French micro sheath for a Bentson wire. A 6 French
vascular sheath was then advanced over the Bentson wire and into the
common femoral vein. An angled catheter was advanced over the
Bentson wire and into the right heart. The Bentson wire was
exchanged for a Glidewire which was then navigated into the main
pulmonary artery. The main pulmonary arteriogram was performed. This
confirms a large filling defect within the right main pulmonary
artery consistent with acute pulmonary embolus. There is
significantly decreased perfusion to the right lung. The catheter
was next navigated into the right main pulmonary artery. Pressures
were obtained. The right pulmonary arterial pressure is 42/20 (mean
30). A right pulmonary arteriogram was performed. Again, there is a
large filling defect in the right main pulmonary artery extending
into the upper lobe branches. There is significantly decreased
perfusion in the right lung.

The angled catheter was successfully navigated through the thrombus
in into a branch of the right lower lobe pulmonary artery. The
glidewire was exchanged for a Rosen wire. A 12 cm infusion length
EKOS catheter was advanced over the wire and positioned across the
thrombus. Pulmonary arterial thrombolysis was then initiated at a
rate of 1 milligram/hour.

The catheters were secured in place and a sterile bandage applied.
The patient tolerated the procedure well and was returned to the
intensive care unit.
FINDINGS: Insufficient right-sided pleural fluid for thoracentesis.

Right main pulmonary arterial pressure 42/20 (30) mm Hg

Large volume thrombus within the right main pulmonary artery
extending into right upper and lower lobar branches with
significantly decreased perfusion to the right lung.
IMPRESSION: 1. Insufficient right-sided pleural fluid for thoracentesis.
Thoracentesis was not performed.
2. Pulmonary arterial hypertension with a right main pulmonary
arterial pressure of 42/20 (30) mm Hg.
3. Initiation of right-sided pulmonary arterial thrombolysis at a
rate of 1 milligram/hour which will continue for a total dose of 24
mg.
